# Patient Record
Sex: Female | Born: 1960 | Race: Black or African American | Hispanic: No | Smoking: Current every day smoker
Health system: Southern US, Community
[De-identification: ages and names within clinical notes are randomized; demographics above are authoritative.]

## PROBLEM LIST (undated history)

## (undated) DIAGNOSIS — T7840XA Allergy, unspecified, initial encounter: Secondary | ICD-10-CM

## (undated) DIAGNOSIS — M329 Systemic lupus erythematosus, unspecified: Secondary | ICD-10-CM

## (undated) DIAGNOSIS — I10 Essential (primary) hypertension: Secondary | ICD-10-CM

## (undated) DIAGNOSIS — IMO0002 Reserved for concepts with insufficient information to code with codable children: Secondary | ICD-10-CM

## (undated) DIAGNOSIS — D649 Anemia, unspecified: Secondary | ICD-10-CM

## (undated) DIAGNOSIS — J45909 Unspecified asthma, uncomplicated: Secondary | ICD-10-CM

## (undated) HISTORY — PX: WRIST SURGERY: SHX841

## (undated) HISTORY — DX: Allergy, unspecified, initial encounter: T78.40XA

## (undated) HISTORY — DX: Essential (primary) hypertension: I10

## (undated) HISTORY — DX: Anemia, unspecified: D64.9

---

## 1998-09-26 HISTORY — PX: ABDOMINAL HYSTERECTOMY: SHX81

## 1998-12-29 ENCOUNTER — Inpatient Hospital Stay (HOSPITAL_COMMUNITY): Admission: EM | Admit: 1998-12-29 | Discharge: 1998-12-30 | Payer: Self-pay | Admitting: *Deleted

## 2001-05-12 ENCOUNTER — Emergency Department (HOSPITAL_COMMUNITY): Admission: EM | Admit: 2001-05-12 | Discharge: 2001-05-13 | Payer: Self-pay | Admitting: Emergency Medicine

## 2001-09-17 ENCOUNTER — Encounter: Admission: RE | Admit: 2001-09-17 | Discharge: 2001-09-17 | Payer: Self-pay | Admitting: Family Medicine

## 2001-09-17 ENCOUNTER — Encounter: Payer: Self-pay | Admitting: Family Medicine

## 2004-07-01 ENCOUNTER — Emergency Department (HOSPITAL_COMMUNITY): Admission: EM | Admit: 2004-07-01 | Discharge: 2004-07-01 | Payer: Self-pay | Admitting: Family Medicine

## 2004-09-26 HISTORY — PX: COLONOSCOPY: SHX174

## 2004-12-22 ENCOUNTER — Emergency Department: Payer: Self-pay | Admitting: Emergency Medicine

## 2008-11-24 ENCOUNTER — Encounter: Admission: RE | Admit: 2008-11-24 | Discharge: 2008-11-24 | Payer: Self-pay | Admitting: Family Medicine

## 2009-10-08 ENCOUNTER — Emergency Department (HOSPITAL_COMMUNITY): Admission: EM | Admit: 2009-10-08 | Discharge: 2009-10-08 | Payer: Self-pay | Admitting: Emergency Medicine

## 2010-01-31 ENCOUNTER — Emergency Department (HOSPITAL_COMMUNITY): Admission: EM | Admit: 2010-01-31 | Discharge: 2010-01-31 | Payer: Self-pay | Admitting: Emergency Medicine

## 2010-11-10 ENCOUNTER — Other Ambulatory Visit: Payer: Self-pay | Admitting: Internal Medicine

## 2010-11-10 DIAGNOSIS — Z1231 Encounter for screening mammogram for malignant neoplasm of breast: Secondary | ICD-10-CM

## 2010-11-18 ENCOUNTER — Ambulatory Visit: Payer: Self-pay

## 2011-12-12 ENCOUNTER — Ambulatory Visit: Payer: Self-pay | Admitting: Family Medicine

## 2011-12-12 DIAGNOSIS — J45909 Unspecified asthma, uncomplicated: Secondary | ICD-10-CM | POA: Insufficient documentation

## 2011-12-12 DIAGNOSIS — I10 Essential (primary) hypertension: Secondary | ICD-10-CM

## 2011-12-12 MED ORDER — KETOCONAZOLE 2 % EX CREA: TOPICAL_CREAM | Freq: Every day | CUTANEOUS | Status: DC

## 2011-12-12 MED ORDER — DOXYCYCLINE HYCLATE 100 MG PO TABS: 100.0000 mg | ORAL_TABLET | Freq: Two times a day (BID) | ORAL | Status: AC

## 2011-12-12 MED ORDER — ALBUTEROL SULFATE HFA 108 (90 BASE) MCG/ACT IN AERS: 2.0000 | INHALATION_SPRAY | Freq: Four times a day (QID) | RESPIRATORY_TRACT | Status: DC | PRN

## 2011-12-12 NOTE — Patient Instructions (Signed)
Hypertension       Hypertension is another name for high blood pressure. High blood pressure may mean that your heart needs to work harder to pump blood. Blood pressure consists of two numbers, which includes a higher number over a lower number (example: 110/72).     HOME CARE     Make lifestyle changes as told by your doctor. This may include weight loss and exercise.   Take your blood pressure medicine every day.   Limit how much salt you use.   Stop smoking if you smoke.   Do not use drugs.   Talk to your doctor if you are using decongestants or birth control pills. These medicines might make blood pressure higher.   Females should not drink more than 1 alcoholic drink per day. Males should not drink more than 2 alcoholic drinks per day.   See your doctor as told.    GET HELP RIGHT AWAY IF:     You have a blood pressure reading with a top number of 180 or higher.   You get a very bad headache.   You get blurred or changing vision.   You feel confused.   You feel weak, numb, or faint.   You get chest or belly (abdominal) pain.   You throw up (vomit).   You cannot breathe very well.    MAKE SURE YOU:     Understand these instructions.   Will watch your condition.   Will get help right away if you are not doing well or get worse.      Document Released: 02/29/2008 Document Revised: 09/01/2011 Document Reviewed: 02/29/2008   ExitCare Patient Information 2012 [employer].

## 2011-12-12 NOTE — Progress Notes (Signed)
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  She keeps getting new lesions that begin with one little spot and then enlargen.  O:  Crusty lesions on chest and back  I spent time trying to convince patient to start on bp meds tonight.  She said she will consider at next wellness visit  A:  Tinea corp with secondary staph infection  P:  See orders.

## 2011-12-22 ENCOUNTER — Ambulatory Visit: Payer: Self-pay | Admitting: Family Medicine

## 2011-12-22 DIAGNOSIS — J45909 Unspecified asthma, uncomplicated: Secondary | ICD-10-CM

## 2011-12-22 DIAGNOSIS — J309 Allergic rhinitis, unspecified: Secondary | ICD-10-CM

## 2011-12-22 MED ORDER — AZITHROMYCIN 250 MG PO TABS: ORAL_TABLET | ORAL | Status: AC

## 2011-12-22 MED ORDER — PREDNISONE 20 MG PO TABS: ORAL_TABLET | ORAL | Status: AC

## 2011-12-22 MED ORDER — ALBUTEROL SULFATE (2.5 MG/3ML) 0.083% IN NEBU
2.5000 mg | INHALATION_SOLUTION | Freq: Once | RESPIRATORY_TRACT | Status: AC
  Administered 2011-12-22: 2.5 mg via RESPIRATORY_TRACT

## 2011-12-22 MED ORDER — IPRATROPIUM BROMIDE 0.02 % IN SOLN
0.5000 mg | Freq: Once | RESPIRATORY_TRACT | Status: AC
  Administered 2011-12-22: 0.5 mg via RESPIRATORY_TRACT

## 2011-12-22 MED ORDER — MOMETASONE FURO-FORMOTEROL FUM 100-5 MCG/ACT IN AERO: 2.0000 | INHALATION_SPRAY | Freq: Two times a day (BID) | RESPIRATORY_TRACT | Status: DC

## 2011-12-22 MED ORDER — ALBUTEROL SULFATE HFA 108 (90 BASE) MCG/ACT IN AERS: 2.0000 | INHALATION_SPRAY | Freq: Four times a day (QID) | RESPIRATORY_TRACT | Status: DC | PRN

## 2011-12-22 NOTE — Progress Notes (Signed)
  Subjective:    Patient ID: Patricia Romero, female    DOB: 1961-04-08, 51 y.o.   MRN: [id number]  HPI Patient presents complaining of progressive cough, wheezing and SOB since Saturday.  Using ProAir prn; has never been on maintenance therapy PMH/ Asthma           Seasonal allergies   Review of Systems  Constitutional: Positive for chills. Negative for fever.  HENT: Positive for congestion.   Respiratory: Positive for cough, chest tightness, shortness of breath and wheezing.   Cardiovascular: Positive for chest pain.       Objective:   Physical Exam  Constitutional: She appears well-developed.  HENT:  Nose: Mucosal edema and rhinorrhea present.  Mouth/Throat: Mucous membranes are normal.  Neck: Neck supple.  Cardiovascular: Normal rate, regular rhythm and normal heart sounds.   Pulmonary/Chest: She has wheezes.  Neurological: She is alert.     After albuterol/atrovent neb- improved air movement With rare expiratory wheeze     Assessment & Plan:   1. Asthma  albuterol (PROVENTIL) (2.5 MG/3ML) 0.083% nebulizer solution 2.5 mg, ipratropium (ATROVENT) nebulizer solution 0.5 mg, mometasone-formoterol (DULERA) 100-5 MCG/ACT AERO, predniSONE (DELTASONE) 20 MG tablet, albuterol (PROVENTIL HFA;VENTOLIN HFA) 108 (90 BASE) MCG/ACT inhaler, azithromycin (ZITHROMAX) 250 MG tablet  2. Allergic rhinitis     Coupon for free Dulera inhaler provided Lots of education regarding asthma and need for maintenance therapy Call with follow up in 24 hours

## 2011-12-22 NOTE — Progress Notes (Deleted)
  Subjective:    Patient ID: Patricia Romero, female    DOB: October 27, 1960, 51 y.o.   MRN: [id number]  HPI    Review of Systems     Objective:   Physical Exam        Assessment & Plan:

## 2012-05-03 ENCOUNTER — Encounter (HOSPITAL_COMMUNITY): Payer: Self-pay | Admitting: Emergency Medicine

## 2012-05-03 ENCOUNTER — Emergency Department (HOSPITAL_COMMUNITY)
Admission: EM | Admit: 2012-05-03 | Discharge: 2012-05-03 | Disposition: A | Discharge status: Home / Self Care | Payer: Self-pay | Attending: Emergency Medicine | Admitting: Emergency Medicine

## 2012-05-03 DIAGNOSIS — J45909 Unspecified asthma, uncomplicated: Secondary | ICD-10-CM | POA: Insufficient documentation

## 2012-05-03 DIAGNOSIS — B354 Tinea corporis: Secondary | ICD-10-CM | POA: Insufficient documentation

## 2012-05-03 HISTORY — DX: Unspecified asthma, uncomplicated: J45.909

## 2012-05-03 MED ORDER — TERBINAFINE HCL 1 % EX CREA: TOPICAL_CREAM | Freq: Two times a day (BID) | CUTANEOUS | Status: DC

## 2012-05-03 NOTE — Discharge Instructions (Signed)
Ringworm, Body [Tinea Corporis]  Ringworm is a fungal infection of the skin and hair. Another name for this problem is Tinea Corporis. It has nothing to do with worms. A fungus is an organism that lives on dead cells (the outer layer of skin). It can involve the entire body. It can spread from infected pets. Tinea corporis can be a problem in wrestlers who may get the infection form other players/opponents, equipment and mats.  DIAGNOSIS   A skin scraping can be obtained from the affected area and by looking for fungus under the microscope. This is called a KOH examination.   HOME CARE INSTRUCTIONS    Ringworm may be treated with a topical antifungal cream, ointment, or oral medications.   If you are using a cream or ointment, wash infected skin. Dry it completely before application.   Scrub the skin with a buff puff or abrasive sponge using a shampoo with ketoconazole to remove dead skin and help treat the ringworm.   Have your pet treated by your veterinarian if it has the same infection.  SEEK MEDICAL CARE IF:    Your ringworm patch (fungus) continues to spread after 7 days of treatment.   Your rash is not gone in 4 weeks. Fungal infections are slow to respond to treatment. Some redness (erythema) may remain for several weeks after the fungus is gone.   The area becomes red, warm, tender, and swollen beyond the patch. This may be a secondary bacterial (germ) infection.   You have a fever.  Document Released: 09/09/2000 Document Revised: 09/01/2011 Document Reviewed: 02/20/2009  [employer] Patient Information 2012 [employer].

## 2012-05-03 NOTE — ED Provider Notes (Signed)
History     CSN: [id number]  Arrival date & time 05/03/12  2031   First MD Initiated Contact with Patient 05/03/12 2153      Chief Complaint  Patient presents with  . Allergic Reaction  . Pruritis    (Consider location/radiation/quality/duration/timing/severity/associated sxs/prior treatment) HPI Comments: Patient has had in her.  Skin rash.  For several weeks, the first area, started on the posterior section of her left upper arm.  Now.  She has a here, anterior neck, and upper back  Patient is a 51 y.o. female presenting with allergic reaction. The history is provided by the patient.  Allergic Reaction The primary symptoms are  rash. The primary symptoms do not include shortness of breath, nausea, diarrhea or dizziness.    Past Medical History  Diagnosis Date  . Asthma     Past Surgical History  Procedure Date  . Abdominal hysterectomy     History reviewed. No pertinent family history.  History  Substance Use Topics  . Smoking status: Not on file  . Smokeless tobacco: Not on file  . Alcohol Use: Yes     Social Use    OB History    Grav Para Term Preterm Abortions TAB SAB Ect Mult Living                  Review of Systems  Constitutional: Negative for fever and chills.  Respiratory: Negative for shortness of breath.   Gastrointestinal: Negative for nausea and diarrhea.  Musculoskeletal: Negative for myalgias.  Skin: Positive for rash. Negative for wound.  Neurological: Negative for dizziness.    Allergies  Cefixime and Bee venom  Home Medications   Current Outpatient Rx  Name Route Sig Dispense Refill  . ALBUTEROL SULFATE HFA 108 (90 BASE) MCG/ACT IN AERS Inhalation Inhale 2 puffs into the lungs every 6 (six) hours as needed.    Marland Kitchen LORATADINE 10 MG PO TABS Oral Take 10 mg by mouth daily.    . TERBINAFINE HCL 1 % EX CREA Topical Apply topically 2 (two) times daily. 30 g 0    BP 161/115  Pulse 74  Temp 98.4 F (36.9 C) (Oral)  Resp 16  SpO2  96%  Physical Exam  Constitutional: She appears well-developed.  HENT:  Head: Normocephalic.  Eyes: Pupils are equal, round, and reactive to light.  Neck: Normal range of motion.  Cardiovascular: Normal rate.   Musculoskeletal: Normal range of motion.  Skin: Skin is warm.       Lesion on her right upper arm appears to be a ringworm, but the lesions on the anterior neck, and upper back, although have the same general appearance.  Are not round, but they do have a central area of clearing with surrounding erythema    ED Course  Procedures (including critical care time)  Labs Reviewed - No data to display No results found.   1. Ringworm of body       MDM   Will treat with topical Lamisil, and have patient followup in 3-5 days for reevaluation        Arman Filter, NP 05/03/12 2219  Arman Filter, NP 05/03/12 2219

## 2012-05-03 NOTE — ED Notes (Signed)
Patient complaining of itchiness and "breaking out" on arms, back, and chest for the past few weeks.  Patient states that the itching has gotten worse of the past few days.  Multiple areas of lesions noted to right arm, neck, and upper back.  Patient states that it could be from possible insect bites.

## 2012-05-03 NOTE — ED Provider Notes (Signed)
Medical screening examination/treatment/procedure(s) were performed by non-physician practitioner and as supervising physician I was immediately available for consultation/collaboration.   Mailin Coglianese, MD 05/03/12 2331 

## 2012-05-06 ENCOUNTER — Emergency Department (HOSPITAL_COMMUNITY)
Admission: EM | Admit: 2012-05-06 | Discharge: 2012-05-06 | Disposition: A | Discharge status: Home / Self Care | Payer: Self-pay | Attending: Emergency Medicine | Admitting: Emergency Medicine

## 2012-05-06 ENCOUNTER — Encounter (HOSPITAL_COMMUNITY): Payer: Self-pay | Admitting: Family Medicine

## 2012-05-06 DIAGNOSIS — L02419 Cutaneous abscess of limb, unspecified: Secondary | ICD-10-CM | POA: Insufficient documentation

## 2012-05-06 DIAGNOSIS — J45909 Unspecified asthma, uncomplicated: Secondary | ICD-10-CM | POA: Insufficient documentation

## 2012-05-06 DIAGNOSIS — L03119 Cellulitis of unspecified part of limb: Secondary | ICD-10-CM

## 2012-05-06 MED ORDER — CEPHALEXIN 500 MG PO CAPS: 500.0000 mg | ORAL_CAPSULE | Freq: Four times a day (QID) | ORAL | Status: AC

## 2012-05-06 MED ORDER — SULFAMETHOXAZOLE-TRIMETHOPRIM 800-160 MG PO TABS: 2.0000 | ORAL_TABLET | Freq: Two times a day (BID) | ORAL | Status: AC

## 2012-05-06 MED ORDER — DIPHENHYDRAMINE HCL 25 MG PO CAPS
25.0000 mg | ORAL_CAPSULE | Freq: Once | ORAL | Status: AC
  Administered 2012-05-06: 25 mg via ORAL
  Filled 2012-05-06: qty 1

## 2012-05-06 NOTE — ED Provider Notes (Signed)
Medical screening examination/treatment/procedure(s) were conducted as a shared visit with non-physician practitioner(s) and myself.  I personally evaluated the patient during the encounter   Cyndra Numbers, MD 05/06/12 [phone]

## 2012-05-06 NOTE — ED Provider Notes (Signed)
History     CSN: [id number]  Arrival date & time 05/06/12  0805   First MD Initiated Contact with Patient 05/06/12 0810      No chief complaint on file.   (Consider location/radiation/quality/duration/timing/severity/associated sxs/prior treatment) HPI Comments: 51 y/o female presents with swelling on left leg s/p being bit by something about 7 hours ago before going to bed. States last night she thought it was a mosqito bite and had no swelling before she went to sleep. It was slightly itchy. When she woke up she noticed swelling and some redness. Came right to the ED and did not try taking anything to bring the swelling down. States she was bit by a spider in the past and had a similar reaction. Denies any fever, chills, rashes, trouble swallowing or breathing, or any pain around her knee.  The history is provided by the patient.    Past Medical History  Diagnosis Date  . Asthma     Past Surgical History  Procedure Date  . Abdominal hysterectomy     No family history on file.  History  Substance Use Topics  . Smoking status: Not on file  . Smokeless tobacco: Not on file  . Alcohol Use: Yes     Social Use    OB History    Grav Para Term Preterm Abortions TAB SAB Ect Mult Living                  Review of Systems  Constitutional: Negative for fever and chills.  HENT: Negative for trouble swallowing.   Respiratory: Negative for apnea.   Musculoskeletal: Negative for arthralgias.  Skin: Positive for color change. Negative for rash.       Positive for swelling    Allergies  Cefixime and Bee venom  Home Medications   Current Outpatient Rx  Name Route Sig Dispense Refill  . ALBUTEROL SULFATE HFA 108 (90 BASE) MCG/ACT IN AERS Inhalation Inhale 2 puffs into the lungs every 6 (six) hours as needed. For shortness of breath      BP 141/103  Pulse 63  Temp 97.9 F (36.6 C) (Oral)  Resp 20  SpO2 100%  Physical Exam  Constitutional: She is oriented to person,  place, and time. She appears well-developed and well-nourished. No distress.  HENT:  Head: Normocephalic and atraumatic.  Mouth/Throat: Oropharynx is clear and moist. No oropharyngeal exudate.  Eyes: Conjunctivae are normal.  Neck: Normal range of motion. Neck supple.  Cardiovascular: Normal rate, regular rhythm and normal heart sounds.   Pulmonary/Chest: Effort normal and breath sounds normal.  Neurological: She is alert and oriented to person, place, and time.  Skin: Skin is warm, dry and intact.       Edema and erythema noted medially near patella. No flucctuance. No open skin. No evidence of bite mark. Warm to touch.  Psychiatric: She has a normal mood and affect. Her behavior is normal.    ED Course  Procedures (including critical care time) INCISION AND DRAINAGE Performed by: Johnnette Gourd Consent: Verbal consent obtained. Risks and benefits: risks, benefits and alternatives were discussed Type: abscess  Body area: left knee  Anesthesia: local infiltration  Local anesthetic: lidocaine 2% without epinephrine  Anesthetic total: 5 ml  Complexity: complex Blunt dissection to break up loculations  Drainage: purulent  Drainage amount: large  Packing material: 1/2 in iodoform gauze  Patient tolerance: Patient tolerated the procedure well with no immediate complications.    Labs Reviewed - No data to  display No results found.   1. Cellulitis and abscess of leg       MDM  51 y/o female with edema on left knee s/p being bit by something about 7 hours ago. Exam not consistent with abscess. Will give benadryl 25mg  po and re-assess. 8:56 AM Erythema and edema decreasing s/p receiving benadryl. Denies developing a boil after previous spider bite. No history of cellulitis. 9:21 AM Developed flucctuance once some edema subsided. Will drain. 9:45 AM Purulent drainage from abscess. Surrounding erythema. Will treat with bactrim and keflex. Case discussed with Dr. Alto Denver  who agrees with plan of care.       Trevor Mace, PA-C 05/06/12 [phone]

## 2012-05-06 NOTE — ED Notes (Signed)
Per pt complaining of abscess to inner aspect of left knee. sts started about 6 hours ago.area red, swollen, hard and warm to touch.

## 2012-05-09 ENCOUNTER — Emergency Department (HOSPITAL_COMMUNITY)
Admission: EM | Admit: 2012-05-09 | Discharge: 2012-05-09 | Disposition: A | Discharge status: Home / Self Care | Payer: Self-pay | Attending: Emergency Medicine | Admitting: Emergency Medicine

## 2012-05-09 ENCOUNTER — Encounter (HOSPITAL_COMMUNITY): Payer: Self-pay | Admitting: *Deleted

## 2012-05-09 DIAGNOSIS — Z4800 Encounter for change or removal of nonsurgical wound dressing: Secondary | ICD-10-CM | POA: Insufficient documentation

## 2012-05-09 DIAGNOSIS — Z5189 Encounter for other specified aftercare: Secondary | ICD-10-CM

## 2012-05-09 DIAGNOSIS — Z91038 Other insect allergy status: Secondary | ICD-10-CM | POA: Insufficient documentation

## 2012-05-09 DIAGNOSIS — Z90710 Acquired absence of both cervix and uterus: Secondary | ICD-10-CM | POA: Insufficient documentation

## 2012-05-09 DIAGNOSIS — J45909 Unspecified asthma, uncomplicated: Secondary | ICD-10-CM | POA: Insufficient documentation

## 2012-05-09 DIAGNOSIS — Z888 Allergy status to other drugs, medicaments and biological substances status: Secondary | ICD-10-CM | POA: Insufficient documentation

## 2012-05-09 NOTE — ED Provider Notes (Signed)
History   This chart was scribed for Gavin Pound. Oletta Lamas, MD by Charolett Bumpers . The patient was seen in room TR09C/TR09C. Patient's care was started at 1118.    CSN: [id number]  Arrival date & time 05/09/12  9629   First MD Initiated Contact with Patient 05/09/12 1118      Chief Complaint  Patient presents with  . Wound Check    (Consider location/radiation/quality/duration/timing/severity/associated sxs/prior treatment) HPI Patricia Romero is a 51 y.o. female who presents to the Emergency Department for a wound check on her left thigh. Pt reports that she had an abscess, that was drained here in ED 3 days ago. Pt was started on abx at that time. Pt reports that the wound has improved with reduced swelling, pain and erythema. Pt is here to have packing removed.   Past Medical History  Diagnosis Date  . Asthma     Past Surgical History  Procedure Date  . Abdominal hysterectomy     History reviewed. No pertinent family history.  History  Substance Use Topics  . Smoking status: Not on file  . Smokeless tobacco: Not on file  . Alcohol Use: Yes     Social Use    OB History    Grav Para Term Preterm Abortions TAB SAB Ect Mult Living                  Review of Systems  Constitutional: Negative for fever and chills.  Gastrointestinal: Negative for nausea and vomiting.  Skin: Positive for wound. Negative for color change.    Allergies  Cefixime and Bee venom  Home Medications   Current Outpatient Rx  Name Route Sig Dispense Refill  . ALBUTEROL SULFATE HFA 108 (90 BASE) MCG/ACT IN AERS Inhalation Inhale 2 puffs into the lungs every 6 (six) hours as needed. For shortness of breath    . CEPHALEXIN 500 MG PO CAPS Oral Take 1 capsule (500 mg total) by mouth 4 (four) times daily. 40 capsule 0  . IBUPROFEN 200 MG PO TABS Oral Take 200 mg by mouth every 6 (six) hours as needed. For pain    . SULFAMETHOXAZOLE-TRIMETHOPRIM 800-160 MG PO TABS Oral Take 2 tablets by  mouth 2 (two) times daily. Two po bid x 10 days 40 tablet 0    BP 143/103  Pulse 72  Temp 98.1 F (36.7 C) (Oral)  Resp 16  SpO2 97%  Physical Exam  Nursing note and vitals reviewed. Constitutional: She is oriented to person, place, and time. She appears well-developed and well-nourished. No distress.  HENT:  Head: Normocephalic and atraumatic.  Neck: Neck supple. No tracheal deviation present.  Cardiovascular: Normal rate.   Pulmonary/Chest: Effort normal. No respiratory distress.  Musculoskeletal: Normal range of motion.  Neurological: She is alert and oriented to person, place, and time.  Skin: Skin is warm and dry. No rash noted.       Packing from a previous abscess removed. On left thigh, wound edges appropriately healing with no surrounding erythema. No pus drainage. Minimally indurated and appropriately minimally tender.    Psychiatric: She has a normal mood and affect. Her behavior is normal.    ED Course  Procedures (including critical care time)  DIAGNOSTIC STUDIES: Oxygen Saturation is 97% on room air, normal by my interpretation.    COORDINATION OF CARE:  11:20-Removed packing from a drained abscess on left thigh. Discussed planned course of treatment with the patient, who is agreeable at this time.  Labs Reviewed - No data to display No results found.   1. Wound check, abscess       MDM  I personally performed the services described in this documentation, which was scribed in my presence. The recorded information has been reviewed and considered.  Appears to be healing appropriately, packing left out to heal.  instructions provided       Gavin Pound. Mauri Temkin, MD 05/11/12 1544

## 2012-05-09 NOTE — Discharge Instructions (Signed)
Wound Check  Your wound appears healthy today. Your wound will heal gradually over time. Eventually a scar will form that will fade with time.  FACTORS THAT AFFECT SCAR FORMATION:   People differ in the severity in which they scar.   Scar severity varies according to location, size, and the traits you inherited from your parents (genetic predisposition).   Irritation to the wound from infection, rubbing, or chemical exposure will increase the amount of scar formation.  HOME CARE INSTRUCTIONS    If you were given a dressing, you should change it at least once a day or as instructed by your caregiver. If the bandage sticks, soak it off with a solution of hydrogen peroxide.   If the bandage becomes wet, dirty, or develops a bad smell, change it as soon as possible.   Look for signs of infection.   Only take over-the-counter or prescription medicines for pain, discomfort, or fever as directed by your caregiver.  SEEK IMMEDIATE MEDICAL CARE IF:    You have redness, swelling, or increasing pain in the wound.   You notice pus coming from the wound.   You have a fever.   You notice a bad smell coming from the wound or dressing.  Document Released: 06/18/2004 Document Revised: 09/01/2011 Document Reviewed: 09/12/2005  [employer] Patient Information 2012 [employer].

## 2012-05-09 NOTE — ED Notes (Signed)
Applied bacitracin to left thigh and covered with bandage patient tolerated without incident

## 2012-05-09 NOTE — ED Notes (Signed)
Pt reports needing packing removed fromwound left thigh, was placed on Sunday.

## 2012-11-02 ENCOUNTER — Emergency Department (HOSPITAL_COMMUNITY)
Admission: EM | Admit: 2012-11-02 | Discharge: 2012-11-02 | Disposition: A | Discharge status: Home / Self Care | Payer: BC Managed Care – PPO | Attending: Emergency Medicine | Admitting: Emergency Medicine

## 2012-11-02 ENCOUNTER — Encounter (HOSPITAL_COMMUNITY): Payer: Self-pay

## 2012-11-02 DIAGNOSIS — J45909 Unspecified asthma, uncomplicated: Secondary | ICD-10-CM | POA: Insufficient documentation

## 2012-11-02 DIAGNOSIS — I10 Essential (primary) hypertension: Secondary | ICD-10-CM | POA: Insufficient documentation

## 2012-11-02 DIAGNOSIS — Z87440 Personal history of urinary (tract) infections: Secondary | ICD-10-CM | POA: Insufficient documentation

## 2012-11-02 DIAGNOSIS — R7402 Elevation of levels of lactic acid dehydrogenase (LDH): Secondary | ICD-10-CM | POA: Insufficient documentation

## 2012-11-02 DIAGNOSIS — Z79899 Other long term (current) drug therapy: Secondary | ICD-10-CM | POA: Insufficient documentation

## 2012-11-02 DIAGNOSIS — Z862 Personal history of diseases of the blood and blood-forming organs and certain disorders involving the immune mechanism: Secondary | ICD-10-CM | POA: Insufficient documentation

## 2012-11-02 DIAGNOSIS — D72819 Decreased white blood cell count, unspecified: Secondary | ICD-10-CM | POA: Insufficient documentation

## 2012-11-02 DIAGNOSIS — R7401 Elevation of levels of liver transaminase levels: Secondary | ICD-10-CM | POA: Insufficient documentation

## 2012-11-02 DIAGNOSIS — Z3202 Encounter for pregnancy test, result negative: Secondary | ICD-10-CM | POA: Insufficient documentation

## 2012-11-02 DIAGNOSIS — R42 Dizziness and giddiness: Secondary | ICD-10-CM | POA: Insufficient documentation

## 2012-11-02 LAB — COMPREHENSIVE METABOLIC PANEL
ALT: 73 U/L — ABNORMAL HIGH (ref 0–35)
AST: 41 U/L — ABNORMAL HIGH (ref 0–37)
Albumin: 4 g/dL (ref 3.5–5.2)
Alkaline Phosphatase: 87 U/L (ref 39–117)
Calcium: 9.4 mg/dL (ref 8.4–10.5)
GFR calc Af Amer: >90 mL/min (ref >90)
Glucose, Bld: 89 mg/dL (ref 70–99)
Potassium: 3.8 mEq/L (ref 3.5–5.1)
Sodium: 139 mEq/L (ref 135–145)
Total Protein: 7.4 g/dL (ref 6.0–8.3)

## 2012-11-02 LAB — CBC WITH DIFFERENTIAL/PLATELET
Basophils Absolute: 0 10*3/uL (ref 0.0–0.1)
Eosinophils Absolute: 0 10*3/uL (ref 0.0–0.7)
Eosinophils Relative: 1 % (ref 0–5)
Lymphs Abs: 1.5 10*3/uL (ref 0.7–4.0)
MCH: 27.5 pg (ref 26.0–34.0)
MCV: 81.9 fL (ref 78.0–100.0)
Neutrophils Relative %: 41 % — ABNORMAL LOW (ref 43–77)
Platelets: 236 10*3/uL (ref 150–400)
RBC: 4.54 MIL/uL (ref 3.87–5.11)
RDW: 13.5 % (ref 11.5–15.5)
WBC: 2.9 10*3/uL — ABNORMAL LOW (ref 4.0–10.5)

## 2012-11-02 LAB — URINALYSIS, ROUTINE W REFLEX MICROSCOPIC
Bilirubin Urine: NEGATIVE
Nitrite: NEGATIVE
Protein, ur: NEGATIVE mg/dL
Specific Gravity, Urine: 1.01 (ref 1.005–1.030)
Urobilinogen, UA: 0.2 mg/dL (ref 0.0–1.0)

## 2012-11-02 MED ORDER — LISINOPRIL 10 MG PO TABS: 10.0000 mg | ORAL_TABLET | Freq: Every day | ORAL | Status: DC

## 2012-11-02 NOTE — ED Provider Notes (Signed)
History     CSN: [id number]  Arrival date & time 11/02/12  1111   First MD Initiated Contact with Patient 11/02/12 1151      Chief Complaint  Patient presents with  . 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 This has been relatively persistent, nothing seems to make it better or worse including standing up or head position and is not associated with vomiting, vertigo, change in hearing, change in coordination or change in speech. She has no changes in vision, no chest pain abdominal pain or back pain and has had no diarrhea rashes or swelling. She does endorse a small amount of numbness to the left forearm and elbow but no problems using her hand and no numbness in the fingers. She also endorses urinary frequency and does have a history of frequent urinary tract infections. Her appetite has been normal, she has not had fevers or chills  The history is provided by the patient.    Past Medical History  Diagnosis Date  . Asthma     Past Surgical History  Procedure Date  . Abdominal hysterectomy     History reviewed. No pertinent family history.  History  Substance Use Topics  . Smoking status: Not on file  . Smokeless tobacco: Not on file  . Alcohol Use: Yes     Comment: Social Use    OB History    Grav Para Term Preterm Abortions TAB SAB Ect Mult Living                  Review of Systems  All other systems reviewed and are negative.    Allergies  Cefixime and Bee venom  Home Medications   Current Outpatient Rx  Name  Route  Sig  Dispense  Refill  . ALBUTEROL SULFATE HFA 108 (90 BASE) MCG/ACT IN AERS   Inhalation   Inhale 2 puffs into the lungs every 6 (six) hours as needed. For shortness  of breath         . LISINOPRIL 10 MG PO TABS   Oral   Take 1 tablet (10 mg total) by mouth daily.   30 tablet   1     BP 172/108  Pulse 89  Temp 98.1 F (36.7 C) (Oral)  Resp 16  SpO2 100%  Physical Exam  Nursing note and vitals reviewed. Constitutional: She appears well-developed and well-nourished. No distress.  HENT:  Head: Normocephalic and atraumatic.  Mouth/Throat: Oropharynx is clear and moist. No oropharyngeal exudate.  Eyes: Conjunctivae normal and EOM are normal. Pupils are equal, round, and reactive to light. Right eye exhibits no discharge. Left eye exhibits no discharge. No scleral icterus.  Neck: Normal range of motion. Neck supple. No JVD present. No thyromegaly present.  Cardiovascular: Normal rate, regular rhythm, normal heart sounds and intact distal pulses.  Exam reveals no gallop and no friction rub.   No murmur heard. Pulmonary/Chest: Effort normal and breath sounds normal. No respiratory distress. She has no wheezes. She has no rales.  Abdominal: Soft. Bowel sounds are normal. She exhibits distension ( Mild suprapubic tenderness, no guarding, no pain at McBurney's point, no peritoneal signs). She exhibits no mass. There is no tenderness.  Musculoskeletal: Normal range of motion. She exhibits no edema and no tenderness.  Normal strength, normal gait except for her right upper extremity which is abnormal after surgery as a 49-year-old child  Lymphadenopathy:    She has no cervical adenopathy.  Neurological: She is alert. Coordination normal.       Speech is clear, cranial nerves III through XII are intact, the patient questions whether there is a slight difference in the sensation to light touch of her cheeks right greater than left. She has normal extraocular movements, normal pupillary response, she is alert and oriented, she is able to ambulate without any difficulty, she has no limb ataxia and no gait ataxia. 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 This is baseline for her.  Skin: Skin is warm and dry. No rash noted. No erythema.  Psychiatric: She has a normal mood and affect. Her behavior is normal.    ED Course  Procedures (including critical care time)  Labs Reviewed  CBC WITH DIFFERENTIAL - Abnormal; Notable for the following:    WBC 2.9 (*)     Neutrophils Relative 41 (*)     Neutro Abs 1.2 (*)     Lymphocytes Relative 50 (*)     All other components within normal limits  COMPREHENSIVE METABOLIC PANEL - Abnormal; Notable for the following:    AST 41 (*)     ALT 73 (*)     All other components within normal limits  URINALYSIS, ROUTINE W REFLEX MICROSCOPIC  POCT PREGNANCY, URINE   No results found.   1. Light headed   2. Transaminitis   3. Leukopenia   4. Hypertension       MDM  Overall the patient is well appearing, vital signs reflect hypertension with a blood pressure of 170/111. Otherwise patient does appear clinically stable. I do not detect any focal neurologic deficits, there is no objective differences in sensation except for a slight amount of the left cheek which is decreased. Her gait is normal, she does not appear to be orthostatic, this could be related to hypertension, 2 anemia, to a urinary tract infection. Labs pending  Patient reexamined, she has no symptoms at that time, blood pressure has improved though at this point she has had to significantly elevated blood pressures and will require treatment for hypertension. She also has a mild transaminitis as well as a mild leukopenia. She has been informed of these results and I have recommended that she follow up very closely with family Dr. for repeat testing and further evaluation. She appears stable for discharge at this     Vida Roller, MD 11/02/12 1429

## 2012-11-02 NOTE — ED Notes (Signed)
Pt sts feeling better. Given something to eat per Dr. Hyacinth Meeker.

## 2012-11-02 NOTE — ED Notes (Signed)
Pt ambulatory back to room.  No dizziness or difficulty walking.  Pt undressed into gown and ready to be seen by provider

## 2012-11-02 NOTE — ED Notes (Signed)
Pt presents with 2 day h/o dizziness.  +nausea  Pt reports numbness to L arm that began while pt was in triage.

## 2012-11-24 DIAGNOSIS — I10 Essential (primary) hypertension: Secondary | ICD-10-CM

## 2012-11-24 HISTORY — DX: Essential (primary) hypertension: I10

## 2013-01-03 ENCOUNTER — Ambulatory Visit (INDEPENDENT_AMBULATORY_CARE_PROVIDER_SITE_OTHER): Payer: BC Managed Care – PPO | Admitting: Family Medicine

## 2013-01-03 VITALS — BP 144/86 | HR 73 | Resp 16 | Ht 67.0 in | Wt 137.4 lb

## 2013-01-03 DIAGNOSIS — D649 Anemia, unspecified: Secondary | ICD-10-CM

## 2013-01-03 DIAGNOSIS — Z1231 Encounter for screening mammogram for malignant neoplasm of breast: Secondary | ICD-10-CM

## 2013-01-03 DIAGNOSIS — Z0000 Encounter for general adult medical examination without abnormal findings: Secondary | ICD-10-CM

## 2013-01-03 DIAGNOSIS — I10 Essential (primary) hypertension: Secondary | ICD-10-CM

## 2013-01-03 DIAGNOSIS — R21 Rash and other nonspecific skin eruption: Secondary | ICD-10-CM

## 2013-01-03 DIAGNOSIS — Z01419 Encounter for gynecological examination (general) (routine) without abnormal findings: Secondary | ICD-10-CM

## 2013-01-03 LAB — POCT UA - MICROSCOPIC ONLY
Casts, Ur, LPF, POC: NEGATIVE
Crystals, Ur, HPF, POC: NEGATIVE
Mucus, UA: NEGATIVE
Yeast, UA: NEGATIVE

## 2013-01-03 LAB — POCT URINALYSIS DIPSTICK
Bilirubin, UA: NEGATIVE
Blood, UA: NEGATIVE
Leukocytes, UA: NEGATIVE
Nitrite, UA: NEGATIVE
Protein, UA: NEGATIVE
pH, UA: 7

## 2013-01-03 LAB — POCT CBC
Granulocyte percent: 50.5 %G (ref 37–80)
HCT, POC: 34.6 % — AB (ref 37.7–47.9)
Lymph, poc: 1.1 (ref 0.6–3.4)
MCHC: 30.1 g/dL — AB (ref 31.8–35.4)
MID (cbc): 0.2 (ref 0–0.9)
MPV: 9.6 fL (ref 0–99.8)
POC Granulocyte: 1.4 — AB (ref 2–6.9)
POC LYMPH PERCENT: 41.7 %L (ref 10–50)
POC MID %: 7.8 %M (ref 0–12)
Platelet Count, POC: 267 10*3/uL (ref 142–424)
RDW, POC: 14.6 %

## 2013-01-03 LAB — POCT GLYCOSYLATED HEMOGLOBIN (HGB A1C): Hemoglobin A1C: 5.5

## 2013-01-03 MED ORDER — LISINOPRIL-HYDROCHLOROTHIAZIDE 10-12.5 MG PO TABS: 1.0000 | ORAL_TABLET | Freq: Every day | ORAL | Status: DC

## 2013-01-03 MED ORDER — ALBUTEROL SULFATE HFA 108 (90 BASE) MCG/ACT IN AERS: 2.0000 | INHALATION_SPRAY | Freq: Four times a day (QID) | RESPIRATORY_TRACT | Status: DC | PRN

## 2013-01-03 NOTE — Patient Instructions (Addendum)
Routine general medical examination at a health care facility - Plan: POCT CBC, POCT glycosylated hemoglobin (Hb A1C), POCT urinalysis dipstick, POCT UA - Microscopic Only, Comprehensive metabolic panel, Lipid panel, TSH, Vitamin D 25 hydroxy, Vitamin B12, Folate, EKG 12-Lead, IFOBT POC (occult bld, rslt in office)  Routine gynecological examination - Plan: Pap IG (Image Guided)  Rash and nonspecific skin eruption - Plan: POCT Skin KOH, Fungus culture w smear   1. PLEASE RETURN IN SIX MONTHS FOR BLOOD PRESSURE FOLLOW-UP.
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
## 2013-01-04 LAB — COMPREHENSIVE METABOLIC PANEL
ALT: 44 U/L — ABNORMAL HIGH (ref 0–35)
AST: 23 U/L (ref 0–37)
Alkaline Phosphatase: 78 U/L (ref 39–117)
BUN: 16 mg/dL (ref 6–23)
Creat: 0.94 mg/dL (ref 0.50–1.10)
Potassium: 4.1 mEq/L (ref 3.5–5.3)

## 2013-01-04 LAB — LIPID PANEL
HDL: 37 mg/dL — ABNORMAL LOW (ref >39)
LDL Cholesterol: 114 mg/dL — ABNORMAL HIGH (ref 0–99)
Total CHOL/HDL Ratio: 5.3 Ratio

## 2013-01-04 LAB — TSH: TSH: 1.124 u[IU]/mL (ref 0.350–4.500)

## 2013-01-05 LAB — VITAMIN D 25 HYDROXY (VIT D DEFICIENCY, FRACTURES): Vit D, 25-Hydroxy: 19 ng/mL — ABNORMAL LOW (ref 30–89)

## 2013-01-07 LAB — PAP IG (IMAGE GUIDED)

## 2013-01-15 ENCOUNTER — Encounter: Payer: Self-pay | Admitting: Family Medicine

## 2013-01-16 MED ORDER — FLUCONAZOLE 150 MG PO TABS: 150.0000 mg | ORAL_TABLET | Freq: Once | ORAL | Status: DC

## 2013-02-04 LAB — CULTURE, FUNGUS WITHOUT SMEAR

## 2013-02-10 ENCOUNTER — Encounter: Payer: Self-pay | Admitting: Family Medicine

## 2013-02-11 ENCOUNTER — Telehealth: Payer: Self-pay

## 2013-02-11 ENCOUNTER — Ambulatory Visit
Admission: RE | Admit: 2013-02-11 | Discharge: 2013-02-11 | Disposition: A | Discharge status: Home / Self Care | Payer: BC Managed Care – PPO | Source: Ambulatory Visit | Attending: Family Medicine | Admitting: Family Medicine

## 2013-02-11 DIAGNOSIS — Z1231 Encounter for screening mammogram for malignant neoplasm of breast: Secondary | ICD-10-CM

## 2013-02-11 NOTE — Telephone Encounter (Signed)
Patient would like to talk to a nurse regarding side effects from her medication [phone]

## 2013-02-11 NOTE — Telephone Encounter (Signed)
Documented this as allergy.

## 2013-02-11 NOTE — Telephone Encounter (Signed)
Pt states 1st caused nausea 2nd pill caused some dizziness she is better now. She states she will come in if the dizziness returns.

## 2013-02-11 NOTE — Telephone Encounter (Signed)
To your pool [place]

## 2013-02-12 ENCOUNTER — Encounter: Payer: Self-pay | Admitting: Family Medicine

## 2013-08-01 ENCOUNTER — Other Ambulatory Visit: Payer: Self-pay

## 2013-11-26 ENCOUNTER — Emergency Department (HOSPITAL_COMMUNITY)
Admission: EM | Admit: 2013-11-26 | Discharge: 2013-11-26 | Disposition: A | Discharge status: Home / Self Care | Payer: BC Managed Care – PPO | Attending: Emergency Medicine | Admitting: Emergency Medicine

## 2013-11-26 ENCOUNTER — Encounter (HOSPITAL_COMMUNITY): Payer: Self-pay | Admitting: Emergency Medicine

## 2013-11-26 DIAGNOSIS — L509 Urticaria, unspecified: Secondary | ICD-10-CM | POA: Insufficient documentation

## 2013-11-26 DIAGNOSIS — Z8739 Personal history of other diseases of the musculoskeletal system and connective tissue: Secondary | ICD-10-CM | POA: Insufficient documentation

## 2013-11-26 DIAGNOSIS — J45909 Unspecified asthma, uncomplicated: Secondary | ICD-10-CM | POA: Insufficient documentation

## 2013-11-26 DIAGNOSIS — I10 Essential (primary) hypertension: Secondary | ICD-10-CM | POA: Insufficient documentation

## 2013-11-26 DIAGNOSIS — Z79899 Other long term (current) drug therapy: Secondary | ICD-10-CM | POA: Insufficient documentation

## 2013-11-26 DIAGNOSIS — Z862 Personal history of diseases of the blood and blood-forming organs and certain disorders involving the immune mechanism: Secondary | ICD-10-CM | POA: Insufficient documentation

## 2013-11-26 HISTORY — DX: Systemic lupus erythematosus, unspecified: M32.9

## 2013-11-26 HISTORY — DX: Reserved for concepts with insufficient information to code with codable children: IMO0002

## 2013-11-26 MED ORDER — PREDNISONE 20 MG PO TABS
60.0000 mg | ORAL_TABLET | Freq: Once | ORAL | Status: AC
  Administered 2013-11-26: 60 mg via ORAL
  Filled 2013-11-26: qty 3

## 2013-11-26 MED ORDER — FAMOTIDINE 20 MG PO TABS
20.0000 mg | ORAL_TABLET | Freq: Once | ORAL | Status: AC
  Administered 2013-11-26: 20 mg via ORAL
  Filled 2013-11-26: qty 1

## 2013-11-26 NOTE — ED Provider Notes (Signed)
Medical screening examination/treatment/procedure(s) were performed by non-physician practitioner and as supervising physician I was immediately available for consultation/collaboration.     Geoffery Lyonsouglas Amila Callies, MD 11/26/13 [phone]

## 2013-11-26 NOTE — ED Notes (Signed)
Pt dc to home. Pt sts understanding to dc instructions. Pt ambulatory to exit without difficulty. 

## 2013-11-26 NOTE — Discharge Instructions (Signed)
Take benadryl (25-50mg ) at least twice a day but up to four times a day for the next three days.  Take pepcid twice a day for the next three days.  You can also try claritin and cool compresses. Try to avoid scratching your skin because it may cause a secondary bacterial infection.  Please return to the ER immediately if your rash worsens, you develop swelling of lips/tongue or tightness of throat .
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
## 2013-11-26 NOTE — ED Notes (Signed)
Pt. reports itchy hives at back and legs onset this evening etiology unknown , respirations unlabored / airway intact.

## 2014-01-08 ENCOUNTER — Other Ambulatory Visit: Payer: Self-pay | Admitting: Family Medicine

## 2014-03-06 ENCOUNTER — Ambulatory Visit (INDEPENDENT_AMBULATORY_CARE_PROVIDER_SITE_OTHER): Payer: PRIVATE HEALTH INSURANCE | Admitting: Physician Assistant

## 2014-03-06 VITALS — BP 132/84 | HR 82 | Temp 97.8°F | Resp 16 | Ht 67.0 in | Wt 146.0 lb

## 2014-03-06 DIAGNOSIS — Z0000 Encounter for general adult medical examination without abnormal findings: Secondary | ICD-10-CM

## 2014-03-06 DIAGNOSIS — Z79899 Other long term (current) drug therapy: Secondary | ICD-10-CM

## 2014-03-06 DIAGNOSIS — K625 Hemorrhage of anus and rectum: Secondary | ICD-10-CM

## 2014-03-06 DIAGNOSIS — M329 Systemic lupus erythematosus, unspecified: Secondary | ICD-10-CM

## 2014-03-06 DIAGNOSIS — IMO0002 Reserved for concepts with insufficient information to code with codable children: Secondary | ICD-10-CM

## 2014-03-06 DIAGNOSIS — I10 Essential (primary) hypertension: Secondary | ICD-10-CM

## 2014-03-06 LAB — POCT URINALYSIS DIPSTICK
Bilirubin, UA: NEGATIVE
Glucose, UA: NEGATIVE
Ketones, UA: NEGATIVE
Leukocytes, UA: NEGATIVE
NITRITE UA: NEGATIVE
PH UA: 5.5
Protein, UA: NEGATIVE
RBC UA: NEGATIVE
SPEC GRAV UA: 1.01
UROBILINOGEN UA: 0.2

## 2014-03-06 LAB — POCT CBC
Granulocyte percent: 58.4 %G (ref 37–80)
HCT, POC: 38.4 % (ref 37.7–47.9)
Hemoglobin: 11.8 g/dL — AB (ref 12.2–16.2)
LYMPH, POC: 1.4 (ref 0.6–3.4)
MCH: 26.5 pg — AB (ref 27–31.2)
MCHC: 30.7 g/dL — AB (ref 31.8–35.4)
MCV: 86.2 fL (ref 80–97)
MID (CBC): 0.3 (ref 0–0.9)
MPV: 9.6 fL (ref 0–99.8)
PLATELET COUNT, POC: 295 10*3/uL (ref 142–424)
POC Granulocyte: 2.3 (ref 2–6.9)
POC LYMPH %: 34.5 % (ref 10–50)
POC MID %: 7.1 % (ref 0–12)
RBC: 4.46 M/uL (ref 4.04–5.48)
RDW, POC: 14.7 %
WBC: 4 10*3/uL — AB (ref 4.6–10.2)

## 2014-03-06 MED ORDER — LISINOPRIL 10 MG PO TABS: 10.0000 mg | ORAL_TABLET | Freq: Every day | ORAL | Status: DC

## 2014-03-06 MED ORDER — HYDROXYCHLOROQUINE SULFATE 200 MG PO TABS: 200.0000 mg | ORAL_TABLET | Freq: Every day | ORAL | Status: DC

## 2014-03-06 NOTE — Progress Notes (Signed)
Subjective:    Patient ID: Patricia AxeBarbara Romero, female    DOB: 10/02/1960, 53 y.o.   MRN: [id number]  HPI  [name] is a very pleasant 53 yr old female here for CPE.  Last CPE April 2011  Complaints: 2 episodes of BRBPR with bowel movements earlier this week; prior history of hemorrhoids - denies discomfort; normal colonscopy 2006; no further episodes of this, regular bowel movements every day GYN:  Hysterectomy in 2000 Mammo: normal 2014; no family hx br ca Colonoscopy:  2006, normal Dentist:  [facility]'t [facility] [place] Eye doctor:  Glasses; started wearing last fall; sees regularly due to plaquenil Imm:  Last tdap 2008 Diet:  Varied diet; stays away from fast food, junk food; mostly water to drink Exercise: walking for 30 minutes - started recently Meds:  Lisinopril, plaquenil Tobacco:  none Etoh:  1 drink/week  Work:  Runner, broadcasting/film/videoTeacher, pre-K  Plaquenil for lupus - has been x 1 yr Dr. Sharyn LullHaverstock - dermatology Has not established with rheum Has been tolerating well  Review of Systems  Constitutional: Negative.   HENT: Negative.   Respiratory: Negative.   Cardiovascular: Negative.   Gastrointestinal: Negative.   Genitourinary: Negative.   Musculoskeletal: Negative.        Objective:   Physical Exam  Vitals reviewed. Constitutional: She is oriented to person, place, and time. She appears well-developed and well-nourished. No distress.  HENT:  Head: Normocephalic and atraumatic.  Right Ear: Tympanic membrane and ear canal normal.  Left Ear: Tympanic membrane and ear canal normal.  Mouth/Throat: Uvula is midline, oropharynx is clear and moist and mucous membranes are normal.  Eyes: Conjunctivae are normal. No scleral icterus.  Neck: Normal range of motion. Neck supple.  Cardiovascular: Normal rate, regular rhythm and normal heart sounds.   Pulmonary/Chest: Effort normal and breath sounds normal. She has no wheezes. She has no rales.  Abdominal: Soft. Bowel sounds are normal.  There is no tenderness.  Genitourinary:  Small external hemorrhoid  Lymphadenopathy:    She has no cervical adenopathy.  Neurological: She is alert and oriented to person, place, and time. She has normal reflexes.  Skin: Skin is warm and dry.  Psychiatric: She has a normal mood and affect. Her behavior is normal.       Assessment & Plan:  1. Routine general medical examination at a health care facility [name] is a very pleasant 53 yr old female here for CPE and medication refills.  Exam is normal today.  Normal pap last year (due 2017), normal mammo last year - we discussed screening options.  She has never had an abnormal mammogram and has no family hx breast ca - pt prefers screening mammo every 2 yrs, which I think is reasonable.  Labs drawn today, including HIV test per pt request.   - POCT CBC - POCT urinalysis dipstick - Comprehensive metabolic panel - TSH - Lipid panel - HIV antibody  2. BRBPR (bright red blood per rectum) Pt reports 2 episodes of bright red blood with bowel movements.  Prior hx of hemorrhoids - small hemorrhoid on exam.  Normal colonoscopy in 2006.  Will send pt home with hemosure tests - if + will send to GI for further eval  - IFOBT POC (occult bld, rslt in office) - IFOBT POC (occult bld, rslt in office) - IFOBT POC (occult bld, rslt in office)  3. Medication management  - POCT CBC - Comprehensive metabolic panel - Ambulatory referral to Rheumatology  4. Hypertension Well  controlled BP on lisinopril.  Continue medication.  - lisinopril (PRINIVIL,ZESTRIL) 10 MG tablet; Take 1 tablet (10 mg total) by mouth daily.  Dispense: 90 tablet; Refill: 1  5. Lupus Pt on plaquenil x 1 yr for lupus diagnosed by dermatologist.  She has never followed with rheum, but would like referral which I think is appropriate.  Check CBC, CMP today.  Plaquenil refilled.  - hydroxychloroquine (PLAQUENIL) 200 MG tablet; Take 1 tablet (200 mg total) by mouth daily.   Dispense: 90 tablet; Refill: 1 - Ambulatory referral to Rheumatology     E. Frances Furbish MHS, PA-C [facility] 6/11/201510:46 PM

## 2014-03-06 NOTE — Patient Instructions (Signed)
Continue taking medications as directed.  Follow up with Dr. Renda Rolls as planned.  Follow up with your eye doctor as planned  I will let you know when your labs are back and if we need to do anything based on them  Please do the stool tests at home and return them to our office.  If these are positive, we may need to send you back to GI  Plan for a mammogram next year (2016) and pap test in two years (2017)   Health Maintenance, Female A healthy lifestyle and preventative care can promote health and wellness.  Maintain regular health, dental, and eye exams.  Eat a healthy diet. Foods like vegetables, fruits, whole grains, low-fat dairy products, and lean protein foods contain the nutrients you need without too many calories. Decrease your intake of foods high in solid fats, added sugars, and salt. Get information about a proper diet from your caregiver, if necessary.  Regular physical exercise is one of the most important things you can do for your health. Most adults should get at least 150 minutes of moderate-intensity exercise (any activity that increases your heart rate and causes you to sweat) each week. In addition, most adults need muscle-strengthening exercises on 2 or more days a week.   Maintain a healthy weight. The body mass index (BMI) is a screening tool to identify possible weight problems. It provides an estimate of body fat based on height and weight. Your caregiver can help determine your BMI, and can help you achieve or maintain a healthy weight. For adults 20 years and older:  A BMI below 18.5 is considered underweight.  A BMI of 18.5 to 24.9 is normal.  A BMI of 25 to 29.9 is considered overweight.  A BMI of 30 and above is considered obese.  Maintain normal blood lipids and cholesterol by exercising and minimizing your intake of saturated fat. Eat a balanced diet with plenty of fruits and vegetables. Blood tests for lipids and cholesterol should begin at age 52  and be repeated every 5 years. If your lipid or cholesterol levels are high, you are over 50, or you are a high risk for heart disease, you may need your cholesterol levels checked more frequently.Ongoing high lipid and cholesterol levels should be treated with medicines if diet and exercise are not effective.  If you smoke, find out from your caregiver how to quit. If you do not use tobacco, do not start.  Lung cancer screening is recommended for adults aged 72 80 years who are at high risk for developing lung cancer because of a history of smoking. Yearly low-dose computed tomography (CT) is recommended for people who have at least a 30-pack-year history of smoking and are a current smoker or have quit within the past 15 years. A pack year of smoking is smoking an average of 1 pack of cigarettes a day for 1 year (for example: 1 pack a day for 30 years or 2 packs a day for 15 years). Yearly screening should continue until the smoker has stopped smoking for at least 15 years. Yearly screening should also be stopped for people who develop a health problem that would prevent them from having lung cancer treatment.  If you are pregnant, do not drink alcohol. If you are breastfeeding, be very cautious about drinking alcohol. If you are not pregnant and choose to drink alcohol, do not exceed 1 drink per day. One drink is considered to be 12 ounces (355 mL) of beer,  5 ounces (148 mL) of wine, or 1.5 ounces (44 mL) of liquor.  Avoid use of street drugs. Do not share needles with anyone. Ask for help if you need support or instructions about stopping the use of drugs.  High blood pressure causes heart disease and increases the risk of stroke. Blood pressure should be checked at least every 1 to 2 years. Ongoing high blood pressure should be treated with medicines, if weight loss and exercise are not effective.  If you are 34 to 51 years old, ask your caregiver if you should take aspirin to prevent  strokes.  Diabetes screening involves taking a blood sample to check your fasting blood sugar level. This should be done once every 3 years, after age 82, if you are within normal weight and without risk factors for diabetes. Testing should be considered at a younger age or be carried out more frequently if you are overweight and have at least 1 risk factor for diabetes.  Breast cancer screening is essential preventative care for women. You should practice "breast self-awareness." This means understanding the normal appearance and feel of your breasts and may include breast self-examination. Any changes detected, no matter how small, should be reported to a caregiver. Women in their 38s and 30s should have a clinical breast exam (CBE) by a caregiver as part of a regular health exam every 1 to 3 years. After age 85, women should have a CBE every year. Starting at age 58, women should consider having a mammogram (breast X-ray) every year. Women who have a family history of breast cancer should talk to their caregiver about genetic screening. Women at a high risk of breast cancer should talk to their caregiver about having an MRI and a mammogram every year.  Breast cancer gene (BRCA)-related cancer risk assessment is recommended for women who have family members with BRCA-related cancers. BRCA-related cancers include breast, ovarian, tubal, and peritoneal cancers. Having family members with these cancers may be associated with an increased risk for harmful changes (mutations) in the breast cancer genes BRCA1 and BRCA2. Results of the assessment will determine the need for genetic counseling and BRCA1 and BRCA2 testing.  The Pap test is a screening test for cervical cancer. Women should have a Pap test starting at age 50. Between ages 31 and 106, Pap tests should be repeated every 2 years. Beginning at age 76, you should have a Pap test every 3 years as long as the past 3 Pap tests have been normal. If you had a  hysterectomy for a problem that was not cancer or a condition that could lead to cancer, then you no longer need Pap tests. If you are between ages 5 and 61, and you have had normal Pap tests going back 10 years, you no longer need Pap tests. If you have had past treatment for cervical cancer or a condition that could lead to cancer, you need Pap tests and screening for cancer for at least 20 years after your treatment. If Pap tests have been discontinued, risk factors (such as a new sexual partner) need to be reassessed to determine if screening should be resumed. Some women have medical problems that increase the chance of getting cervical cancer. In these cases, your caregiver may recommend more frequent screening and Pap tests.  The human papillomavirus (HPV) test is an additional test that may be used for cervical cancer screening. The HPV test looks for the virus that can cause the cell changes on the  cervix. The cells collected during the Pap test can be tested for HPV. The HPV test could be used to screen women aged 64 years and older, and should be used in women of any age who have unclear Pap test results. After the age of 65, women should have HPV testing at the same frequency as a Pap test.  Colorectal cancer can be detected and often prevented. Most routine colorectal cancer screening begins at the age of 45 and continues through age 60. However, your caregiver may recommend screening at an earlier age if you have risk factors for colon cancer. On a yearly basis, your caregiver may provide home test kits to check for hidden blood in the stool. Use of a small camera at the end of a tube, to directly examine the colon (sigmoidoscopy or colonoscopy), can detect the earliest forms of colorectal cancer. Talk to your caregiver about this at age 13, when routine screening begins. Direct examination of the colon should be repeated every 5 to 10 years through age 53, unless early forms of pre-cancerous  polyps or small growths are found.  Hepatitis C blood testing is recommended for all people born from [birth year] through 1963 and any individual with known risks for hepatitis C.  Practice safe sex. Use condoms and avoid high-risk sexual practices to reduce the spread of sexually transmitted infections (STIs). Sexually active women aged 41 and younger should be checked for Chlamydia, which is a common sexually transmitted infection. Older women with new or multiple partners should also be tested for Chlamydia. Testing for other STIs is recommended if you are sexually active and at increased risk.  Osteoporosis is a disease in which the bones lose minerals and strength with aging. This can result in serious bone fractures. The risk of osteoporosis can be identified using a bone density scan. Women ages 46 and over and women at risk for fractures or osteoporosis should discuss screening with their caregivers. Ask your caregiver whether you should be taking a calcium supplement or vitamin D to reduce the rate of osteoporosis.  Menopause can be associated with physical symptoms and risks. Hormone replacement therapy is available to decrease symptoms and risks. You should talk to your caregiver about whether hormone replacement therapy is right for you.  Use sunscreen. Apply sunscreen liberally and repeatedly throughout the day. You should seek shade when your shadow is shorter than you. Protect yourself by wearing long sleeves, pants, a wide-brimmed hat, and sunglasses year round, whenever you are outdoors.  Notify your caregiver of new moles or changes in moles, especially if there is a change in shape or color. Also notify your caregiver if a mole is larger than the size of a pencil eraser.  Stay current with your immunizations. Document Released: 03/28/2011 Document Revised: 01/07/2013 Document Reviewed: 03/28/2011 [facility] Patient Information 2014 [place].

## 2014-03-07 LAB — COMPREHENSIVE METABOLIC PANEL
ALBUMIN: 4.6 g/dL (ref 3.5–5.2)
ALK PHOS: 72 U/L (ref 39–117)
ALT: 50 U/L — ABNORMAL HIGH (ref 0–35)
AST: 23 U/L (ref 0–37)
BUN: 16 mg/dL (ref 6–23)
CALCIUM: 9.6 mg/dL (ref 8.4–10.5)
CHLORIDE: 102 meq/L (ref 96–112)
CO2: 29 mEq/L (ref 19–32)
CREATININE: 0.95 mg/dL (ref 0.50–1.10)
GLUCOSE: 117 mg/dL — AB (ref 70–99)
POTASSIUM: 4 meq/L (ref 3.5–5.3)
Sodium: 141 mEq/L (ref 135–145)
Total Bilirubin: 0.4 mg/dL (ref 0.2–1.2)
Total Protein: 6.9 g/dL (ref 6.0–8.3)

## 2014-03-07 LAB — LIPID PANEL
Cholesterol: 199 mg/dL (ref 0–200)
HDL: 44 mg/dL (ref >39)
LDL CALC: 98 mg/dL (ref 0–99)
Total CHOL/HDL Ratio: 4.5 Ratio
Triglycerides: 284 mg/dL — ABNORMAL HIGH (ref <150)
VLDL: 57 mg/dL — AB (ref 0–40)

## 2014-03-07 LAB — TSH: TSH: 0.981 u[IU]/mL (ref 0.350–4.500)

## 2014-03-11 ENCOUNTER — Encounter: Payer: Self-pay | Admitting: Physician Assistant

## 2014-03-11 ENCOUNTER — Other Ambulatory Visit: Payer: Self-pay | Admitting: Physician Assistant

## 2014-03-11 DIAGNOSIS — Z114 Encounter for screening for human immunodeficiency virus [HIV]: Secondary | ICD-10-CM

## 2014-03-17 ENCOUNTER — Telehealth: Payer: Self-pay

## 2014-03-17 NOTE — Telephone Encounter (Signed)
Questions answered.

## 2014-03-17 NOTE — Telephone Encounter (Signed)
PATIENT WANTS TO KNOW HOW THE FECAL TEST WORKS.

## 2014-03-24 ENCOUNTER — Other Ambulatory Visit (INDEPENDENT_AMBULATORY_CARE_PROVIDER_SITE_OTHER): Payer: PRIVATE HEALTH INSURANCE | Admitting: *Deleted

## 2014-03-24 DIAGNOSIS — Z1159 Encounter for screening for other viral diseases: Secondary | ICD-10-CM

## 2014-03-24 DIAGNOSIS — Z114 Encounter for screening for human immunodeficiency virus [HIV]: Secondary | ICD-10-CM

## 2014-03-24 LAB — IFOBT (OCCULT BLOOD)
IMMUNOLOGICAL FECAL OCCULT BLOOD TEST: NEGATIVE
IMMUNOLOGICAL FECAL OCCULT BLOOD TEST: NEGATIVE
IMMUNOLOGICAL FECAL OCCULT BLOOD TEST: NEGATIVE

## 2014-03-26 LAB — HIV ANTIBODY (ROUTINE TESTING W REFLEX): HIV: NONREACTIVE

## 2014-06-17 ENCOUNTER — Ambulatory Visit (INDEPENDENT_AMBULATORY_CARE_PROVIDER_SITE_OTHER): Payer: No Typology Code available for payment source | Admitting: Family Medicine

## 2014-06-17 VITALS — BP 132/80 | HR 68 | Temp 97.9°F | Resp 16 | Ht 67.0 in | Wt 142.0 lb

## 2014-06-17 DIAGNOSIS — R7989 Other specified abnormal findings of blood chemistry: Secondary | ICD-10-CM

## 2014-06-17 DIAGNOSIS — R6889 Other general symptoms and signs: Secondary | ICD-10-CM

## 2014-06-17 DIAGNOSIS — R7309 Other abnormal glucose: Secondary | ICD-10-CM

## 2014-06-17 DIAGNOSIS — M329 Systemic lupus erythematosus, unspecified: Secondary | ICD-10-CM

## 2014-06-17 DIAGNOSIS — R945 Abnormal results of liver function studies: Secondary | ICD-10-CM

## 2014-06-17 DIAGNOSIS — R739 Hyperglycemia, unspecified: Secondary | ICD-10-CM

## 2014-06-17 DIAGNOSIS — Z79899 Other long term (current) drug therapy: Secondary | ICD-10-CM

## 2014-06-17 NOTE — Patient Instructions (Signed)
We will call to your dermatologist to determine what specific concerns on labwork were.  We can call you after we receive that information for possible lab only visit or to discuss further workup if needed.
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
## 2014-07-11 ENCOUNTER — Other Ambulatory Visit: Payer: Self-pay

## 2015-01-19 ENCOUNTER — Ambulatory Visit (INDEPENDENT_AMBULATORY_CARE_PROVIDER_SITE_OTHER): Payer: No Typology Code available for payment source

## 2015-01-19 ENCOUNTER — Ambulatory Visit (INDEPENDENT_AMBULATORY_CARE_PROVIDER_SITE_OTHER): Payer: No Typology Code available for payment source | Admitting: Family Medicine

## 2015-01-19 VITALS — BP 90/76 | HR 102 | Temp 99.9°F | Resp 24 | Ht 67.0 in | Wt 135.1 lb

## 2015-01-19 DIAGNOSIS — R509 Fever, unspecified: Secondary | ICD-10-CM | POA: Diagnosis not present

## 2015-01-19 DIAGNOSIS — J4520 Mild intermittent asthma, uncomplicated: Secondary | ICD-10-CM | POA: Diagnosis not present

## 2015-01-19 DIAGNOSIS — Z8709 Personal history of other diseases of the respiratory system: Secondary | ICD-10-CM | POA: Diagnosis not present

## 2015-01-19 DIAGNOSIS — R05 Cough: Secondary | ICD-10-CM

## 2015-01-19 DIAGNOSIS — R059 Cough, unspecified: Secondary | ICD-10-CM

## 2015-01-19 LAB — POCT CBC
GRANULOCYTE PERCENT: 71.3 % (ref 37–80)
HEMATOCRIT: 40.8 % (ref 37.7–47.9)
Hemoglobin: 12.7 g/dL (ref 12.2–16.2)
Lymph, poc: 1.4 (ref 0.6–3.4)
MCH: 26.2 pg — AB (ref 27–31.2)
MCHC: 31.2 g/dL — AB (ref 31.8–35.4)
MCV: 84 fL (ref 80–97)
MID (CBC): 0.2 (ref 0–0.9)
MPV: 8.3 fL (ref 0–99.8)
POC GRANULOCYTE: 4.1 (ref 2–6.9)
POC LYMPH %: 24.6 % (ref 10–50)
POC MID %: 4.1 % (ref 0–12)
Platelet Count, POC: 235 10*3/uL (ref 142–424)
RBC: 4.86 M/uL (ref 4.04–5.48)
RDW, POC: 14.7 %
WBC: 5.7 10*3/uL (ref 4.6–10.2)

## 2015-01-19 MED ORDER — HYDROCODONE-HOMATROPINE 5-1.5 MG/5ML PO SYRP: 5.0000 mL | ORAL_SOLUTION | ORAL | Status: DC | PRN

## 2015-01-19 MED ORDER — ALBUTEROL SULFATE HFA 108 (90 BASE) MCG/ACT IN AERS: 2.0000 | INHALATION_SPRAY | Freq: Four times a day (QID) | RESPIRATORY_TRACT | Status: DC | PRN

## 2015-01-19 MED ORDER — BENZONATATE 100 MG PO CAPS: 100.0000 mg | ORAL_CAPSULE | Freq: Three times a day (TID) | ORAL | Status: DC | PRN

## 2015-01-19 NOTE — Progress Notes (Signed)
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 It started as a cold but immediately became a cough and is continued on. She gets wheezing. She is coughing quite hard. She's had a temperature of 102. She no longer smokes. She did not want any.  Objective: Wasn't lady, alert constant coughing. TMs normal except for some wax in the right. Throat clear. Neck supple without significant nodes. Chest has rhonchi throughout both lungs wheezing on the left upper. Heart regular without murmurs.  [facility] reading (PRIMARY) by  Dr. Alwyn RenHopper Normal chest x-ray.   Results for orders placed or performed in visit on 01/19/15  POCT CBC  Result Value Ref Range   WBC 5.7 4.6 - 10.2 K/uL   Lymph, poc 1.4 0.6 - 3.4   POC LYMPH PERCENT 24.6 10 - 50 %L   MID (cbc) 0.2 0 - 0.9   POC MID % 4.1 0 - 12 %M   POC Granulocyte 4.1 2 - 6.9   Granulocyte percent 71.3 37 - 80 %G   RBC 4.86 4.04 - 5.48 M/uL   Hemoglobin 12.7 12.2 - 16.2 g/dL   HCT, POC 16.140.8 09.637.7 - 47.9 %   MCV 84.0 80 - 97 fL   MCH, POC 26.2 (A) 27 - 31.2 pg   MCHC 31.2 (A) 31.8 - 35.4 g/dL   RDW, POC 04.514.7 %   Platelet Count, POC 235 142 - 424 K/uL   MPV 8.3 0 - 99.8 fL   Assessment: Viral bronchitis Cough Fever Wheezing  Plan: Albuterol, Tessalon, Hycodan, symptomatic treatments

## 2015-01-19 NOTE — Patient Instructions (Signed)
Drink plenty of fluids  Get enough rest  Stay off work through tomorrow  Use the inhaler 2 relations every 4-6 hours as needed for wheezing and coughing  Take the Hycodan cough syrup 1 teaspoon every 4-6 hours as needed for cough. This will tend to cause sedation and you probably don't want to take it when you're trying to work  Take the benzonatate cough pills one or 2 pills 3 times daily as needed for cough. These aren't as strong but they do not cause sedation so you can take them when you're going to work

## 2015-05-02 ENCOUNTER — Emergency Department (HOSPITAL_COMMUNITY)
Admission: EM | Admit: 2015-05-02 | Discharge: 2015-05-02 | Disposition: A | Discharge status: Home / Self Care | Payer: No Typology Code available for payment source | Attending: Emergency Medicine | Admitting: Emergency Medicine

## 2015-05-02 ENCOUNTER — Emergency Department (HOSPITAL_COMMUNITY): Payer: No Typology Code available for payment source

## 2015-05-02 ENCOUNTER — Encounter (HOSPITAL_COMMUNITY): Payer: Self-pay | Admitting: *Deleted

## 2015-05-02 DIAGNOSIS — Y929 Unspecified place or not applicable: Secondary | ICD-10-CM | POA: Insufficient documentation

## 2015-05-02 DIAGNOSIS — Y999 Unspecified external cause status: Secondary | ICD-10-CM | POA: Insufficient documentation

## 2015-05-02 DIAGNOSIS — S6991XA Unspecified injury of right wrist, hand and finger(s), initial encounter: Secondary | ICD-10-CM | POA: Insufficient documentation

## 2015-05-02 DIAGNOSIS — W19XXXA Unspecified fall, initial encounter: Secondary | ICD-10-CM

## 2015-05-02 DIAGNOSIS — Y9389 Activity, other specified: Secondary | ICD-10-CM | POA: Insufficient documentation

## 2015-05-02 DIAGNOSIS — W010XXA Fall on same level from slipping, tripping and stumbling without subsequent striking against object, initial encounter: Secondary | ICD-10-CM | POA: Insufficient documentation

## 2015-05-02 DIAGNOSIS — J45909 Unspecified asthma, uncomplicated: Secondary | ICD-10-CM | POA: Insufficient documentation

## 2015-05-02 DIAGNOSIS — S50312A Abrasion of left elbow, initial encounter: Secondary | ICD-10-CM | POA: Insufficient documentation

## 2015-05-02 DIAGNOSIS — Z8739 Personal history of other diseases of the musculoskeletal system and connective tissue: Secondary | ICD-10-CM | POA: Insufficient documentation

## 2015-05-02 DIAGNOSIS — M25522 Pain in left elbow: Secondary | ICD-10-CM

## 2015-05-02 DIAGNOSIS — Z862 Personal history of diseases of the blood and blood-forming organs and certain disorders involving the immune mechanism: Secondary | ICD-10-CM | POA: Insufficient documentation

## 2015-05-02 DIAGNOSIS — I10 Essential (primary) hypertension: Secondary | ICD-10-CM | POA: Insufficient documentation
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
## 2015-05-02 NOTE — ED Notes (Signed)
Taken for xray at this time. 
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
## 2015-05-02 NOTE — Discharge Instructions (Signed)
Musculoskeletal Pain Take ibuprofen or Tylenol for pain. Follow-up with her primary care physician. Apply ice. Rest. Elevate. Musculoskeletal pain is muscle and boney aches and pains. These pains can occur in any part of the body. Your caregiver may treat you without knowing the cause of the pain. They may treat you if blood or urine tests, X-rays, and other tests were normal.  CAUSES There is often not a definite cause or reason for these pains. These pains may be caused by a type of germ (virus). The discomfort may also come from overuse. Overuse includes working out too hard when your body is not fit. Boney aches also come from weather changes. Bone is sensitive to atmospheric pressure changes. HOME CARE INSTRUCTIONS   Ask when your test results will be ready. Make sure you get your test results.  Only take over-the-counter or prescription medicines for pain, discomfort, or fever as directed by your caregiver. If you were given medications for your condition, do not drive, operate machinery or power tools, or sign legal documents for 24 hours. Do not drink alcohol. Do not take sleeping pills or other medications that may interfere with treatment.  Continue all activities unless the activities cause more pain. When the pain lessens, slowly resume normal activities. Gradually increase the intensity and duration of the activities or exercise.  During periods of severe pain, bed rest may be helpful. Lay or sit in any position that is comfortable.  Putting ice on the injured area.  Put ice in a bag.  Place a towel between your skin and the bag.  Leave the ice on for 15 to 20 minutes, 3 to 4 times a day.  Follow up with your caregiver for continued problems and no reason can be found for the pain. If the pain becomes worse or does not go away, it may be necessary to repeat tests or do additional testing. Your caregiver may need to look further for a possible cause. SEEK IMMEDIATE MEDICAL CARE  IF:  You have pain that is getting worse and is not relieved by medications.  You develop chest pain that is associated with shortness or breath, sweating, feeling sick to your stomach (nauseous), or throw up (vomit).  Your pain becomes localized to the abdomen.  You develop any new symptoms that seem different or that concern you. MAKE SURE YOU:   Understand these instructions.  Will watch your condition.  Will get help right away if you are not doing well or get worse. Document Released: 09/12/2005 Document Revised: 12/05/2011 Document Reviewed: 05/17/2013 [facility] Patient Information 2015 [place], [place]. This information is not intended to replace advice given to you by your health care provider. Make sure you discuss any questions you have with your health care provider.

## 2015-05-18 ENCOUNTER — Ambulatory Visit (INDEPENDENT_AMBULATORY_CARE_PROVIDER_SITE_OTHER): Payer: Self-pay | Admitting: Physician Assistant

## 2015-05-18 VITALS — BP 126/80 | HR 62 | Temp 98.5°F | Resp 16 | Ht 67.0 in | Wt 137.6 lb

## 2015-05-18 DIAGNOSIS — Z111 Encounter for screening for respiratory tuberculosis: Secondary | ICD-10-CM

## 2015-05-18 NOTE — Patient Instructions (Signed)
Return in 48-72 hours to have your test read.

## 2015-05-18 NOTE — Progress Notes (Signed)
  Tuberculosis Risk Questionnaire  1. No Were you born outside the [place] in one of the following parts of the world: [place], Asia, Central [place], [place] America or Eastern [place]?    2. No Have you traveled outside the [place] and lived for more than one month in one of the following parts of the world: [place], [place], [place], [place] or Eastern [place]?    3. No Do you have a compromised immune system such as from any of the following conditions:HIV/AIDS, organ or bone marrow transplantation, diabetes, immunosuppressive medicines (e.g. Prednisone, Remicaide), leukemia, lymphoma, cancer of the head or neck, gastrectomy or jejunal bypass, end-stage renal disease (on dialysis), or silicosis?     4. No Have you ever or do you plan on working in: a residential care center, a health care facility, a jail or prison or homeless shelter?    5. No Have you ever: injected illegal drugs, used crack cocaine, lived in a homeless shelter  or been in jail or prison?     6. Yes  Have you ever been exposed to anyone with infectious tuberculosis?    Tuberculosis Symptom Questionnaire  Do you currently have any of the following symptoms?  1. No Unexplained cough lasting more than 3 weeks?   2. No Unexplained fever lasting more than 3 weeks.   3. No Night Sweats (sweating that leaves the bedclothes and sheets wet)     4. No Shortness of Breath   5. No Chest Pain   6. No Unintentional weight loss    7. No Unexplained fatigue (very tired for no reason)   
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
## 2015-05-20 ENCOUNTER — Ambulatory Visit (INDEPENDENT_AMBULATORY_CARE_PROVIDER_SITE_OTHER): Payer: No Typology Code available for payment source | Admitting: *Deleted

## 2015-05-20 ENCOUNTER — Encounter: Payer: Self-pay | Admitting: *Deleted

## 2015-05-20 DIAGNOSIS — Z111 Encounter for screening for respiratory tuberculosis: Secondary | ICD-10-CM

## 2015-05-20 LAB — TB SKIN TEST
INDURATION: 0 mm
TB Skin Test: NEGATIVE

## 2015-07-13 ENCOUNTER — Other Ambulatory Visit: Payer: Self-pay

## 2015-07-13 DIAGNOSIS — I10 Essential (primary) hypertension: Secondary | ICD-10-CM

## 2015-07-13 MED ORDER — LISINOPRIL 10 MG PO TABS: 10.0000 mg | ORAL_TABLET | Freq: Every day | ORAL | Status: DC

## 2015-07-19 NOTE — Progress Notes (Signed)
  Medical screening examination/treatment/procedure(s) were performed by non-physician practitioner and as supervising physician I was immediately available for consultation/collaboration.     

## 2015-09-04 ENCOUNTER — Ambulatory Visit (INDEPENDENT_AMBULATORY_CARE_PROVIDER_SITE_OTHER): Payer: Self-pay | Admitting: Physician Assistant

## 2015-09-04 VITALS — BP 108/68 | HR 74 | Temp 99.0°F | Resp 14 | Ht 67.0 in | Wt 135.0 lb

## 2015-09-04 DIAGNOSIS — H9201 Otalgia, right ear: Secondary | ICD-10-CM

## 2015-09-04 DIAGNOSIS — J029 Acute pharyngitis, unspecified: Secondary | ICD-10-CM

## 2015-09-04 LAB — POCT RAPID STREP A (OFFICE): RAPID STREP A SCREEN: NEGATIVE

## 2015-09-04 NOTE — Progress Notes (Signed)
  Medical screening examination/treatment/procedure(s) were performed by non-physician practitioner and as supervising physician I was immediately available for consultation/collaboration.     

## 2015-09-04 NOTE — Progress Notes (Signed)
   Subjective:    Patient ID: Quinn AxeBarbara Marmolejos, female    DOB: 11/29/1960, 54 y.o.   MRN: [id number]  Chief Complaint  Patient presents with  . Fever    Began last night  . Headache  . Sore Throat  . Ear Pain    Right ear    Medications, allergies, past medical history, surgical history, family history, social history and problem list reviewed and updated.  HPI  [age] yof presents with above complaints.   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 Right ear felt congested last night with pain anterior to ear. This has improved today. Mild frontal HA last night started gradually. Not worst of life. No neck pain. Did not check temp at home but felt warm. Sore throat started last night as well. No voice change. No chills. No sob or cough.   Review of Systems No cp, abd pain, n/v, diarrhea.     Objective:   Physical Exam  Constitutional: She appears well-developed and well-nourished.  Non-toxic appearance. She does not have a sickly appearance. She does not appear ill. No distress.  BP 108/68 mmHg  Pulse 74  Temp(Src) 99 F (37.2 C) (Oral)  Resp 14  Ht 5\' 7"  (1.702 m)  Wt 135 lb (61.236 kg)  BMI 21.14 kg/m2  SpO2 99%   HENT:  Right Ear: Tympanic membrane normal. Tympanic membrane is not erythematous.  Left Ear: Tympanic membrane normal. Tympanic membrane is not erythematous.  Nose: Right sinus exhibits no maxillary sinus tenderness and no frontal sinus tenderness. Left sinus exhibits no maxillary sinus tenderness and no frontal sinus tenderness.  Mouth/Throat: Uvula is midline, oropharynx is clear and moist and mucous membranes are normal.  No mastoid tenderness. No tragus tenderness. No erythema. No tenderness with auricle manipulation.   Neck: No Brudzinski's sign noted.  Pulmonary/Chest: Effort normal and breath sounds normal. No tachypnea.  Lymphadenopathy:       Head (right side): No submental, no submandibular and no tonsillar adenopathy  present.       Head (left side): No submental, no submandibular and no tonsillar adenopathy present.    She has no cervical adenopathy.   Results for orders placed or performed in visit on 09/04/15  POCT rapid strep A  Result Value Ref Range   Rapid Strep A Screen Negative Negative      Assessment & Plan:   Sore throat - Plan: POCT rapid strep A  Right ear pain --exam benign, vitals stable --no mastoid tenderness, TM normal --throat clear, no tender LAN, pt wished to have strep test as she is often around students with strep - strep swab negative --suspect viral uri, rest/fluids/tyelnol prn --rtc 3 days if not improving or if persistent fevers   Donnajean Lopesodd M. Kiya Eno, PA-C Physician Assistant-Certified [facility]  09/04/2015 11:33 AM

## 2015-09-04 NOTE — Addendum Note (Signed)
Addended by: Carmelina DaneANDERSON, Deysha Cartier S on: 09/04/2015 02:49 PM   Modules accepted: Kipp BroodSmartSet

## 2015-09-04 NOTE — Patient Instructions (Signed)
Your strep swab was negative.  You most likely have a viral upper respiratory infection. Rest, fluids, and tylenol should help to improve over the next few days. If you're not feeling better by early next or you start having persistent fevers please let us know asap.

## 2016-02-15 ENCOUNTER — Ambulatory Visit (INDEPENDENT_AMBULATORY_CARE_PROVIDER_SITE_OTHER): Payer: BLUE CROSS/BLUE SHIELD | Admitting: Physician Assistant

## 2016-02-15 VITALS — BP 138/86 | HR 92 | Temp 98.4°F | Resp 16 | Ht 67.0 in | Wt 137.0 lb

## 2016-02-15 DIAGNOSIS — J4520 Mild intermittent asthma, uncomplicated: Secondary | ICD-10-CM

## 2016-02-15 DIAGNOSIS — Z760 Encounter for issue of repeat prescription: Secondary | ICD-10-CM | POA: Diagnosis not present

## 2016-02-15 DIAGNOSIS — Z139 Encounter for screening, unspecified: Secondary | ICD-10-CM

## 2016-02-15 DIAGNOSIS — Z0000 Encounter for general adult medical examination without abnormal findings: Secondary | ICD-10-CM

## 2016-02-15 DIAGNOSIS — I10 Essential (primary) hypertension: Secondary | ICD-10-CM

## 2016-02-15 LAB — POCT CBC
GRANULOCYTE PERCENT: 47.7 % (ref 37–80)
HEMATOCRIT: 37.4 % — AB (ref 37.7–47.9)
Hemoglobin: 12.6 g/dL (ref 12.2–16.2)
Lymph, poc: 1.9 (ref 0.6–3.4)
MCH: 27.6 pg (ref 27–31.2)
MCHC: 33.6 g/dL (ref 31.8–35.4)
MCV: 82.1 fL (ref 80–97)
MID (cbc): 0.3 (ref 0–0.9)
MPV: 7.9 fL (ref 0–99.8)
PLATELET COUNT, POC: 235 10*3/uL (ref 142–424)
POC GRANULOCYTE: 2 (ref 2–6.9)
POC LYMPH %: 45.5 % (ref 10–50)
POC MID %: 6.8 %M (ref 0–12)
RBC: 4.56 M/uL (ref 4.04–5.48)
RDW, POC: 13.7 %
WBC: 4.1 10*3/uL — AB (ref 4.6–10.2)

## 2016-02-15 LAB — POCT GLYCOSYLATED HEMOGLOBIN (HGB A1C): Hemoglobin A1C: 5.7

## 2016-02-15 MED ORDER — ALBUTEROL SULFATE HFA 108 (90 BASE) MCG/ACT IN AERS: 2.0000 | INHALATION_SPRAY | Freq: Four times a day (QID) | RESPIRATORY_TRACT | Status: AC | PRN

## 2016-02-15 MED ORDER — LISINOPRIL 10 MG PO TABS: 10.0000 mg | ORAL_TABLET | Freq: Every day | ORAL | Status: DC

## 2016-02-15 NOTE — Progress Notes (Signed)
   02/15/2016 6:02 PM   DOB: 10/27/1960 / MRN: [id number]  SUBJECTIVE:  Quinn AxeBarbara Cogan is a 55 y.o. female presenting for an annual physical.   She is doing well and denies any complaints today. Her last pap was 3 years ago. She has screened negative for HIV.  Never smoker.    Immunization History  Administered Date(s) Administered  . PPD Test 05/18/2015  . Tdap 08/04/2007     She is allergic to cefixime; bee venom; and diflucan.   She  has a past medical history of Anemia; Hypertension (11/24/2012); Allergy; Asthma; and Lupus (HCC).    She  reports that she has never smoked. She has never used smokeless tobacco. She reports that she drinks alcohol. She reports that she does not use illicit drugs. She  has no sexual activity history on file. The patient  has past surgical history that includes Abdominal hysterectomy (09/26/1998); Colonoscopy (09/26/2004); and Wrist surgery.  Her family history includes Asthma in her sister; Cancer in her brother and sister; Heart disease in her mother; Hypertension in her brother, brother, brother, daughter, and mother.  Review of Systems  Constitutional: Negative for fever and chills.  Respiratory: Negative for cough.   Cardiovascular: Negative for chest pain.  Genitourinary: Negative for dysuria.  Skin: Negative for rash.  Neurological: Negative for dizziness and headaches.    Problem list and medications reviewed and updated by myself where necessary, and exist elsewhere in the encounter.   OBJECTIVE:  BP 138/86 mmHg  Pulse 92  Temp(Src) 98.4 F (36.9 C)  Resp 16  Ht 5\' 7"  (1.702 m)  Wt 137 lb (62.143 kg)  BMI 21.45 kg/m2  SpO2 98%  Physical Exam  Constitutional: She is oriented to person, place, and time. She appears well-nourished. No distress.  Eyes: EOM are normal. Pupils are equal, round, and reactive to light.  Cardiovascular: Normal rate and regular rhythm.   Pulmonary/Chest: Effort normal and breath sounds normal.  Abdominal:  She exhibits no distension.  Genitourinary: Vagina normal and uterus normal. No vaginal discharge found.  Musculoskeletal: Normal range of motion.  Neurological: She is alert and oriented to person, place, and time. No cranial nerve deficit. Gait normal.  Skin: Skin is dry. She is not diaphoretic.  Psychiatric: She has a normal mood and affect.  Vitals reviewed.   No results found for this or any previous visit (from the past 72 hour(s)).  No results found.  ASSESSMENT AND PLAN  Britta MccreedyBarbara was seen today for annual exam and employment physical.  Diagnoses and all orders for this visit:  Annual physical exam: Will get her medication refilled for one year.  She is doing quite well.  See screenings due below.  Needs a TDAP next year.   Screening -     Pap IG w/ reflex to HPV when ASC-U -     MM DIGITAL SCREENING BILATERAL; Future -     POCT CBC -     POCT glycosylated hemoglobin (Hb A1C) -     Lipid panel -     TSH -     COMPLETE METABOLIC PANEL WITH GFR -     HIV antibody    The patient was advised to call or return to clinic if she does not see an improvement in symptoms or to seek the care of the closest emergency department if she worsens with the above plan.   Deliah BostonMichael Brette Cast, MHS, PA-C [facility] 02/15/2016 6:02 PM

## 2016-02-15 NOTE — Patient Instructions (Signed)
     IF you received an x-ray today, you will receive an invoice from [facility]. Please contact [facility] at [phone] with questions or concerns regarding your invoice.   IF you received labwork today, you will receive an invoice from [facility]. Please contact [facility] at [phone] with questions or concerns regarding your invoice.   Our billing staff will not be able to assist you with questions regarding bills from these companies.  You will be contacted with the lab results as soon as they are available. The fastest way to get your results is to activate your My Chart account. Instructions are located on the last page of this paperwork. If you have not heard from us regarding the results in 2 weeks, please contact this office.      

## 2016-02-16 LAB — LIPID PANEL
Cholesterol: 177 mg/dL (ref 125–200)
HDL: 43 mg/dL — ABNORMAL LOW (ref >=46)
LDL CALC: 105 mg/dL (ref <130)
Total CHOL/HDL Ratio: 4.1 Ratio (ref <=5.0)
Triglycerides: 146 mg/dL (ref <150)
VLDL: 29 mg/dL (ref <30)

## 2016-02-16 LAB — COMPLETE METABOLIC PANEL WITH GFR
ALBUMIN: 4.6 g/dL (ref 3.6–5.1)
ALK PHOS: 93 U/L (ref 33–130)
ALT: 24 U/L (ref 6–29)
AST: 19 U/L (ref 10–35)
BILIRUBIN TOTAL: 0.6 mg/dL (ref 0.2–1.2)
BUN: 16 mg/dL (ref 7–25)
CALCIUM: 9.8 mg/dL (ref 8.6–10.4)
CHLORIDE: 104 mmol/L (ref 98–110)
CO2: 24 mmol/L (ref 20–31)
Creat: 0.9 mg/dL (ref 0.50–1.05)
GFR, EST AFRICAN AMERICAN: 84 mL/min (ref >=60)
GFR, Est Non African American: 73 mL/min (ref >=60)
Glucose, Bld: 148 mg/dL — ABNORMAL HIGH (ref 65–99)
Potassium: 4.3 mmol/L (ref 3.5–5.3)
Sodium: 140 mmol/L (ref 135–146)
TOTAL PROTEIN: 7.3 g/dL (ref 6.1–8.1)

## 2016-02-16 LAB — TSH: TSH: 0.86 mIU/L

## 2016-02-17 ENCOUNTER — Other Ambulatory Visit: Payer: Self-pay | Admitting: Physician Assistant

## 2016-02-17 DIAGNOSIS — Z1231 Encounter for screening mammogram for malignant neoplasm of breast: Secondary | ICD-10-CM

## 2016-02-17 LAB — PAP IG W/ RFLX HPV ASCU

## 2016-02-17 LAB — HIV ANTIBODY (ROUTINE TESTING W REFLEX): HIV: NONREACTIVE

## 2016-05-05 ENCOUNTER — Ambulatory Visit: Payer: No Typology Code available for payment source

## 2016-05-10 ENCOUNTER — Other Ambulatory Visit: Payer: Self-pay | Admitting: Physician Assistant

## 2016-05-10 DIAGNOSIS — I10 Essential (primary) hypertension: Secondary | ICD-10-CM

## 2016-05-12 ENCOUNTER — Other Ambulatory Visit: Payer: Self-pay

## 2016-05-12 DIAGNOSIS — I10 Essential (primary) hypertension: Secondary | ICD-10-CM

## 2016-05-12 MED ORDER — LISINOPRIL 10 MG PO TABS: 10.0000 mg | ORAL_TABLET | Freq: Every day | ORAL | 2 refills | Status: DC

## 2016-05-17 DIAGNOSIS — Z0271 Encounter for disability determination: Secondary | ICD-10-CM

## 2016-10-19 ENCOUNTER — Ambulatory Visit (INDEPENDENT_AMBULATORY_CARE_PROVIDER_SITE_OTHER): Payer: Medicaid Other | Admitting: Internal Medicine

## 2016-10-19 ENCOUNTER — Ambulatory Visit (HOSPITAL_COMMUNITY)
Admission: RE | Admit: 2016-10-19 | Discharge: 2016-10-19 | Disposition: A | Discharge status: Home / Self Care | Payer: Medicaid Other | Source: Ambulatory Visit | Attending: Internal Medicine | Admitting: Internal Medicine

## 2016-10-19 VITALS — BP 148/95 | HR 74 | Temp 98.1°F | Ht 67.0 in | Wt 141.0 lb

## 2016-10-19 DIAGNOSIS — J4520 Mild intermittent asthma, uncomplicated: Secondary | ICD-10-CM

## 2016-10-19 DIAGNOSIS — Z79899 Other long term (current) drug therapy: Secondary | ICD-10-CM | POA: Diagnosis not present

## 2016-10-19 DIAGNOSIS — Z8049 Family history of malignant neoplasm of other genital organs: Secondary | ICD-10-CM

## 2016-10-19 DIAGNOSIS — M329 Systemic lupus erythematosus, unspecified: Secondary | ICD-10-CM

## 2016-10-19 DIAGNOSIS — I10 Essential (primary) hypertension: Secondary | ICD-10-CM | POA: Diagnosis not present

## 2016-10-19 DIAGNOSIS — Z825 Family history of asthma and other chronic lower respiratory diseases: Secondary | ICD-10-CM

## 2016-10-19 DIAGNOSIS — Z888 Allergy status to other drugs, medicaments and biological substances status: Secondary | ICD-10-CM | POA: Diagnosis not present

## 2016-10-19 DIAGNOSIS — Z91030 Bee allergy status: Secondary | ICD-10-CM

## 2016-10-19 DIAGNOSIS — M79642 Pain in left hand: Secondary | ICD-10-CM | POA: Insufficient documentation

## 2016-10-19 DIAGNOSIS — M62838 Other muscle spasm: Secondary | ICD-10-CM | POA: Insufficient documentation

## 2016-10-19 DIAGNOSIS — Z0000 Encounter for general adult medical examination without abnormal findings: Secondary | ICD-10-CM

## 2016-10-19 DIAGNOSIS — Z823 Family history of stroke: Secondary | ICD-10-CM | POA: Diagnosis not present

## 2016-10-19 DIAGNOSIS — M25522 Pain in left elbow: Secondary | ICD-10-CM | POA: Insufficient documentation

## 2016-10-19 DIAGNOSIS — Z8249 Family history of ischemic heart disease and other diseases of the circulatory system: Secondary | ICD-10-CM

## 2016-10-19 DIAGNOSIS — F17210 Nicotine dependence, cigarettes, uncomplicated: Secondary | ICD-10-CM | POA: Diagnosis not present

## 2016-10-19 DIAGNOSIS — Z881 Allergy status to other antibiotic agents status: Secondary | ICD-10-CM | POA: Diagnosis not present

## 2016-10-19 MED ORDER — HYDROXYCHLOROQUINE SULFATE 200 MG PO TABS: 200.0000 mg | ORAL_TABLET | Freq: Every day | ORAL | 1 refills | Status: DC

## 2016-10-19 MED ORDER — LISINOPRIL 10 MG PO TABS: 10.0000 mg | ORAL_TABLET | Freq: Every day | ORAL | 2 refills | Status: DC

## 2016-10-19 NOTE — Assessment & Plan Note (Signed)
Patient complains of muscle spasms of her left hand. 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 She notes that her symptoms began roughly one week ago. She has baseline numbness and tingling in that extremity. These episodes are typically self-limited lasting between 5-10 minutes. She notes that she has fasciculation-type symptoms of her first 3 digits. She has no pain with these episodes. She has not tried to use her extremity during these episodes. On exam she has no atrophy noted of the thenar or hyperthenar musculature. She has intact sensation and strength. She has no elicitation of symptoms on palpation of the ulnar or median nerves around the elbow.  Patient notes that the symptoms are not terribly bothersome and have not impacted her daily functioning as of yet. I would recommend that we evaluate initially with plain films of the elbow and hand, as well as some basic electrolyte testing for potassium, calcium abnormalities. If the patient's symptoms persist or worsen I would consider EMG testing and/or referral to neurology.   Plan: X-ray of left elbow and left hand. CMP today.

## 2016-10-19 NOTE — Patient Instructions (Signed)
It was a pleasure to meet you today. We will get some blood tests and Xrays of your arm to evaluate your muscle spasms. I will call you with these resutls.  Continue to take your other medications regularly and if you notice that your joint pain is worsening, please call our clinic back for further evaluation.

## 2016-10-19 NOTE — Assessment & Plan Note (Signed)
Patient presents carrying a diagnosis of lupus. She has been on Plaquenil therapy for several years now. She initially developed cutaneous findings which were thought to be related and was seen by dermatology at that time. Chart review demonstrates that she was seen by rheumatology and at that time was found have a negative ANA. The patient reports that she has been seeing ophthalmology regularly for eye exams on her Plaquenil therapy. She complains of diffuse arthralgias and joint pain of the large and small joints. She denies any current skin changes or rash. She attempts to stay out of the sun in order to prevent these from developing.  Given the patient's negative ANA I am concerned that she may have been misdiagnosed with lupus. However she does have significant history of dermatologic issues combined with diffuse arthralgias which are suggestive of rheumatologic disorder. I cannot find records of more extensive workup having been done for this. I will look into obtaining further records and will defer further workup to her follow-up visit.  Plan: Continue Plaquenil 200 mg daily.

## 2016-10-19 NOTE — Assessment & Plan Note (Addendum)
-   declines flu shot - colonoscopy at age 56 - Mammogram 2 yrs ago, due now  Plan: Referral for mammography placed.

## 2016-10-19 NOTE — Assessment & Plan Note (Signed)
Patient is a history of high blood pressure which is well controlled on lisinopril 10 mg daily. She reports she takes this medication regularly. Patient denies any chest pain, shortness breath, changes in vision, headaches.  Blood pressures mildly elevated today at 148/95. Chart review demonstrates that she has previously been well controlled and would not recommend changing her therapy at this time.  Plan: Continue lisinopril 10 mg daily. Reevaluate blood pressure in 3 months.

## 2016-10-19 NOTE — Progress Notes (Signed)
CC: Establish Care for HTN, Asthma, and Lupus HPI: [name] is a [age] y.o. female with a h/o of SLE, HTN, Asthma who presents to establish care and for evaluation and management of the above conditions.  Please see Problem-based charting for HPI and the status of patient's chronic medical conditions.  Past Medical History:  Diagnosis Date  . Allergy    Claritin  year round  . Anemia   . Asthma    no hospitalizations; one ED visit per year; Albuterol use once per week.  . Hypertension 11/24/2012  . Lupus    Social History  Substance Use Topics  . Smoking status: Light Tobacco Smoker    Types: Cigarettes  . Smokeless tobacco: Never Used     Comment: Occasional  . Alcohol use No     Comment: Social Use   Family History  Problem Relation Age of Onset  . Hypertension Mother   . Heart disease Mother     AMI age 54; CHF; stents placed.  . Asthma Sister   . Cancer Sister     cervical  . Hypertension Brother   . Hypertension Brother   . CVA Brother   . Hypertension Daughter   . Hypertension Brother    Review of Systems: ROS in HPI. Otherwise: Review of Systems  Constitutional: Negative for chills, fever and weight loss.  Respiratory: Negative for cough and shortness of breath.   Cardiovascular: Negative for chest pain and leg swelling.  Gastrointestinal: Negative for abdominal pain, constipation, diarrhea, nausea and vomiting.  Genitourinary: Negative for dysuria, frequency and urgency.  Musculoskeletal: Positive for joint pain and myalgias.  Neurological: Positive for tingling and tremors.   Physical Exam: Vitals:   10/19/16 0824  BP: (!) 148/95  Pulse: 74  Temp: 98.1 F (36.7 C)  TempSrc: Oral  SpO2: 100%  Weight: 141 lb (64 kg)  Height: 5\' 7"  (1.702 m)   Physical Exam  Constitutional: She appears well-developed. She is cooperative. No distress.  Cardiovascular: Normal rate, regular rhythm, normal heart sounds and normal pulses.  Exam reveals no  gallop.   No murmur heard. Pulmonary/Chest: Effort normal and breath sounds normal. No respiratory distress. She has no wheezes. She has no rhonchi. She has no rales. Breasts are symmetrical.  Abdominal: Soft. Bowel sounds are normal. There is no tenderness.  Musculoskeletal: She exhibits no edema.    Assessment & Plan:  See encounters tab for problem based medical decision making. Patient discussed with Dr. Rogelia BogaButcher  Healthcare maintenance - declines flu shot - colonoscopy at age 56 - Mammogram 2 yrs ago, due now  Plan: Referral for mammography placed.  Lupus Patient presents carrying a diagnosis of lupus. She has been on Plaquenil therapy for several years now. She initially developed cutaneous findings which were thought to be related and was seen by dermatology at that time. Chart review demonstrates that she was seen by rheumatology and at that time was found have a negative ANA. The patient reports that she has been seeing ophthalmology regularly for eye exams on her Plaquenil therapy. She complains of diffuse arthralgias and joint pain of the large and small joints. She denies any current skin changes or rash. She attempts to stay out of the sun in order to prevent these from developing.  Given the patient's negative ANA I am concerned that she may have been misdiagnosed with lupus. However she does have significant history of dermatologic issues combined with diffuse arthralgias which are suggestive of rheumatologic disorder.  I cannot find records of more extensive workup having been done for this. I will look into obtaining further records and will defer further workup to her follow-up visit.  Plan: Continue Plaquenil 200 mg daily.   Muscle spasm Patient complains of muscle spasms of her left hand. 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 She notes that her symptoms began  roughly one week ago. She has baseline numbness and tingling in that extremity. These episodes are typically self-limited lasting between 5-10 minutes. She notes that she has fasciculation-type symptoms of her first 3 digits. She has no pain with these episodes. She has not tried to use her extremity during these episodes. On exam she has no atrophy noted of the thenar or hyperthenar musculature. She has intact sensation and strength. She has no elicitation of symptoms on palpation of the ulnar or median nerves around the elbow.  Patient notes that the symptoms are not terribly bothersome and have not impacted her daily functioning as of yet. I would recommend that we evaluate initially with plain films of the elbow and hand, as well as some basic electrolyte testing for potassium, calcium abnormalities. If the patient's symptoms persist or worsen I would consider EMG testing and/or referral to neurology.   Plan: X-ray of left elbow and left hand. CMP today.   Asthma Patient has a history of mild intermittent asthma which is uncompensated. She's never been hospitalized. She does not have any chronic controller inhalers. She takes albuterol as needed but has not had significant use of this in the last several months.  Patient is currently well controlled.  Plan: Continue albuterol when necessary  Hypertension Patient is a history of high blood pressure which is well controlled on lisinopril 10 mg daily. She reports she takes this medication regularly. Patient denies any chest pain, shortness breath, changes in vision, headaches.  Blood pressures mildly elevated today at 148/95. Chart review demonstrates that she has previously been well controlled and would not recommend changing her therapy at this time.  Plan: Continue lisinopril 10 mg daily. Reevaluate blood pressure in 3 months.   Signed: Carolynn Comment, MD 10/19/2016, 12:18 PM  Pager: [phone]

## 2016-10-19 NOTE — Assessment & Plan Note (Signed)
Patient has a history of mild intermittent asthma which is uncompensated. She's never been hospitalized. She does not have any chronic controller inhalers. She takes albuterol as needed but has not had significant use of this in the last several months.  Patient is currently well controlled.  Plan: Continue albuterol when necessary

## 2016-10-20 ENCOUNTER — Encounter: Payer: Self-pay | Admitting: Internal Medicine

## 2016-10-20 LAB — CMP14 + ANION GAP
ALBUMIN: 4.4 g/dL (ref 3.5–5.5)
ALK PHOS: 91 IU/L (ref 39–117)
ALT: 43 IU/L — ABNORMAL HIGH (ref 0–32)
AST: 25 IU/L (ref 0–40)
Albumin/Globulin Ratio: 1.8 (ref 1.2–2.2)
Anion Gap: 16 mmol/L (ref 10.0–18.0)
BUN/Creatinine Ratio: 16 (ref 9–23)
BUN: 15 mg/dL (ref 6–24)
Bilirubin Total: 0.4 mg/dL (ref 0.0–1.2)
CHLORIDE: 104 mmol/L (ref 96–106)
CO2: 25 mmol/L (ref 18–29)
Calcium: 9.7 mg/dL (ref 8.7–10.2)
Creatinine, Ser: 0.91 mg/dL (ref 0.57–1.00)
GFR calc non Af Amer: 72 mL/min/{1.73_m2} (ref >59)
GFR, EST AFRICAN AMERICAN: 83 mL/min/{1.73_m2} (ref >59)
GLOBULIN, TOTAL: 2.5 g/dL (ref 1.5–4.5)
Glucose: 89 mg/dL (ref 65–99)
Potassium: 4.6 mmol/L (ref 3.5–5.2)
SODIUM: 145 mmol/L — AB (ref 134–144)
TOTAL PROTEIN: 6.9 g/dL (ref 6.0–8.5)

## 2016-10-22 NOTE — Progress Notes (Signed)
Internal Medicine Clinic Attending  Case discussed with Dr. Strelow at the time of the visit.  We reviewed the resident's history and exam and pertinent patient test results.  I agree with the assessment, diagnosis, and plan of care documented in the resident's note.  

## 2016-10-24 ENCOUNTER — Ambulatory Visit: Payer: Self-pay

## 2016-10-28 ENCOUNTER — Ambulatory Visit: Payer: Self-pay

## 2016-11-03 ENCOUNTER — Ambulatory Visit: Payer: Self-pay

## 2016-11-04 ENCOUNTER — Ambulatory Visit: Payer: Self-pay | Admitting: Internal Medicine

## 2016-11-04 ENCOUNTER — Ambulatory Visit: Payer: Self-pay

## 2016-12-06 ENCOUNTER — Ambulatory Visit
Admission: RE | Admit: 2016-12-06 | Discharge: 2016-12-06 | Disposition: A | Discharge status: Home / Self Care | Payer: Medicaid Other | Source: Ambulatory Visit | Attending: Internal Medicine | Admitting: Internal Medicine

## 2016-12-06 DIAGNOSIS — Z0000 Encounter for general adult medical examination without abnormal findings: Secondary | ICD-10-CM

## 2016-12-27 ENCOUNTER — Other Ambulatory Visit: Payer: Self-pay

## 2016-12-27 DIAGNOSIS — M329 Systemic lupus erythematosus, unspecified: Secondary | ICD-10-CM

## 2016-12-27 NOTE — Telephone Encounter (Signed)
hydroxychloroquine (PLAQUENIL) 200 MG tablet, refill request @ [facility] on [address].

## 2017-01-02 NOTE — Telephone Encounter (Signed)
Pt had refill from 1/24, called pharmacy, this script went to [facility] and they do not carry it, called [facility] and gave script

## 2017-02-10 ENCOUNTER — Ambulatory Visit (INDEPENDENT_AMBULATORY_CARE_PROVIDER_SITE_OTHER): Payer: Medicaid Other | Admitting: Internal Medicine

## 2017-02-10 DIAGNOSIS — S90511A Abrasion, right ankle, initial encounter: Secondary | ICD-10-CM | POA: Diagnosis present

## 2017-02-10 DIAGNOSIS — W19XXXA Unspecified fall, initial encounter: Secondary | ICD-10-CM

## 2017-02-10 DIAGNOSIS — T148XXA Other injury of unspecified body region, initial encounter: Secondary | ICD-10-CM

## 2017-02-10 NOTE — Progress Notes (Signed)
Internal Medicine Clinic Attending  Case discussed with Dr. Ahmed at the time of the visit.  We reviewed the resident's history and exam and pertinent patient test results.  I agree with the assessment, diagnosis, and plan of care documented in the resident's note. 

## 2017-02-10 NOTE — Assessment & Plan Note (Signed)
Has skin abrasion from falling on right medial aspect of the ankle. No signs of infection or any other complication.  Supportive care with pain control (tylenol). Keep clean and covered.

## 2017-02-10 NOTE — Progress Notes (Signed)
   CC: ankle injury  HPI:  [name] is a 56 y.o. with pmh as listed below is here for ankle injury   Past Medical History:  Diagnosis Date  . Allergy    Claritin  year round  . Anemia   . Asthma    no hospitalizations; one ED visit per year; Albuterol use once per week.  . Hypertension 11/24/2012  . Lupus    Patient had a fell 2 days ago and has an abrasion on her right ankle which had clear drainage and bleeding. Has kept it covered with bandaid. No fever, has pain. No swelling. Wanted to come in and make sure it is not infected. No other sx.    Review of Systems:   Review of Systems  Constitutional: Negative for chills and fever.  Cardiovascular: Negative for chest pain and palpitations.  Musculoskeletal: Negative for myalgias and neck pain.  Skin:       Skin abrasion  Neurological: Negative for dizziness and headaches.  Psychiatric/Behavioral: Negative for depression.     Physical Exam:  Vitals:   02/10/17 1358  BP: 140/89  Pulse: 79  Temp: 98.1 F (36.7 C)  TempSrc: Oral  SpO2: 100%  Weight: 148 lb 1.6 oz (67.2 kg)   Physical Exam  Constitutional: She is oriented to person, place, and time. She appears well-developed and well-nourished. No distress.  HENT:  Head: Normocephalic and atraumatic.  Respiratory: Effort normal and breath sounds normal. No respiratory distress. She has no wheezes.  Musculoskeletal: Normal range of motion. She exhibits no edema.  Has area of abrasion from recent fall over right medial ankle. No induration, fluctuance, erythema, warmth, drainage, bleeding, or any other signs of infection.  Neurological: She is alert and oriented to person, place, and time.  Skin: She is not diaphoretic.  Psychiatric: She has a normal mood and affect.    Assessment & Plan:   See Encounters Tab for problem based charting.  Patient discussed with Dr. Cleda DaubE. Hoffman

## 2017-02-10 NOTE — Patient Instructions (Signed)
You have a skin abrasion  Supportive care with pain control (tylenol). Keep clean and covered.    Abrasion An abrasion is a cut or scrape on the surface of your skin. An abrasion does not go through all of the layers of your skin. It is important to take good care of your abrasion to prevent infection. Follow these instructions at home: Medicines   Take or apply medicines only as told by your doctor.  If you were prescribed an antibiotic ointment, finish all of it even if you start to feel better. Wound care   Clean the wound with mild soap and water 2-3 times per day or as told by your doctor. Pat your wound dry with a clean towel. Do not rub it.  There are many ways to close and cover a wound. Follow instructions from your doctor about:  How to take care of your wound.  When and how you should change your bandage (dressing).  When and how you should take off your dressing.  Check your wound every day for signs of infection. Watch for:  Redness, swelling, or pain.  Fluid, blood, or pus. General instructions   Keep the dressing dry as told by your doctor. Do not take baths, swim, use a hot tub, or do anything that would put your wound underwater until your doctor says it is okay.  If there is swelling, raise (elevate) the injured area above the level of your heart while you are sitting or lying down.  Keep all follow-up visits as told by your doctor. This is important. Contact a doctor if:  You were given a tetanus shot and you have any of these where the needle went in:  Swelling.  Very bad pain.  Redness.  Bleeding.  Medicine does not help your pain.  You have any of these at the site of the wound:  More redness.  More swelling.  More pain. Get help right away if:  You have a red streak going away from your wound.  You have a fever.  You have fluid, blood, or pus coming from your wound.  There is a bad smell coming from your wound. This  information is not intended to replace advice given to you by your health care provider. Make sure you discuss any questions you have with your health care provider. Document Released: 02/29/2008 Document Revised: 02/18/2016 Document Reviewed: 09/10/2014 Elsevier Interactive Patient Education  2017 [employer]

## 2017-02-10 NOTE — Addendum Note (Signed)
Addended by: Carlynn PurlHOFFMAN, Kobee Medlen C on: 02/10/2017 03:23 PM   Modules accepted: Level of Service

## 2017-02-20 ENCOUNTER — Telehealth: Payer: Self-pay | Admitting: Internal Medicine

## 2017-02-20 NOTE — Telephone Encounter (Signed)
   Reason for call:   I received a call from Ms. Quinn AxeBarbara Romero at 5:31 PM asking she wants to know what her doctor wanted her to do about her ankle. Stated she was recently seen in the internal medicine clinic.    Pertinent Data:   Larey SeatFell 12 days ago - slipped off a pavement because it was raining. Scraped off skin and ankle has been swollen since then.   Skin abrasion improving  Swelling not improving  Pain with ambulation; not improving   States she took 1 OTC Ibuprofen (200 mg) a day and stopped taking it 2 days ago because it wasn't helping.  Seen in the clinic on 5/18 and advised to take Tylenol. Patient is not taking Tylenol.   Denies having any fevers or chills. Reports feeling well otherwise.    Assessment / Plan / Recommendations:   Ankle pain likely secondary to sprain. However, since the swelling has not improved for the past 12 days she might need an x-ray to r/o fracture. No imaging has been done since she injured her ankle.   Advised her go to the clinic tomorrow morning. Until then, take OTC Ibuprofen 200 mg: 2 tabs (400 mg) q6 hrs.   RICE  Patient agreed with the plan.   As always, pt is advised that if symptoms worsen or new symptoms arise, they should go to an urgent care facility or to to ER for further evaluation.   John Giovanniathore, Favian Kittleson, MD   02/20/2017, 5:36 PM

## 2017-02-21 ENCOUNTER — Ambulatory Visit (INDEPENDENT_AMBULATORY_CARE_PROVIDER_SITE_OTHER): Payer: Medicaid Other | Admitting: Internal Medicine

## 2017-02-21 ENCOUNTER — Ambulatory Visit (HOSPITAL_COMMUNITY)
Admission: RE | Admit: 2017-02-21 | Discharge: 2017-02-21 | Disposition: A | Discharge status: Home / Self Care | Payer: Medicaid Other | Source: Ambulatory Visit | Attending: Student in an Organized Health Care Education/Training Program | Admitting: Student in an Organized Health Care Education/Training Program

## 2017-02-21 ENCOUNTER — Encounter: Payer: Self-pay | Admitting: Internal Medicine

## 2017-02-21 DIAGNOSIS — F17210 Nicotine dependence, cigarettes, uncomplicated: Secondary | ICD-10-CM | POA: Diagnosis not present

## 2017-02-21 DIAGNOSIS — M25571 Pain in right ankle and joints of right foot: Secondary | ICD-10-CM

## 2017-02-21 DIAGNOSIS — Z79899 Other long term (current) drug therapy: Secondary | ICD-10-CM | POA: Diagnosis not present

## 2017-02-21 DIAGNOSIS — Z9181 History of falling: Secondary | ICD-10-CM

## 2017-02-21 DIAGNOSIS — I10 Essential (primary) hypertension: Secondary | ICD-10-CM | POA: Diagnosis not present

## 2017-02-21 MED ORDER — LISINOPRIL 20 MG PO TABS: 20.0000 mg | ORAL_TABLET | Freq: Every day | ORAL | 2 refills | Status: DC

## 2017-02-21 NOTE — Progress Notes (Signed)
Internal Medicine Clinic Attending  Case discussed with Dr. Ahmed at the time of the visit.  We reviewed the resident's history and exam and pertinent patient test results.  I agree with the assessment, diagnosis, and plan of care documented in the resident's note. 

## 2017-02-21 NOTE — Progress Notes (Signed)
   CC: right ankle pain   HPI:  [name] is a 56 y.o. with pmh as listed below is here for right ankle pain   She was here on 5/18 with abrasion over the right ankle after falling 2 days prior to that. She is here today because she is still having pain on the medial malleolus area, hurts to walk but is able to walk. Swelling has improved.   Past Medical History:  Diagnosis Date  . Allergy    Claritin  year round  . Anemia   . Asthma    no hospitalizations; one ED visit per year; Albuterol use once per week.  . Hypertension 11/24/2012  . Lupus     Review of Systems:   Review of Systems  Constitutional: Negative for chills and fever.  Respiratory: Negative for shortness of breath.   Cardiovascular: Negative for chest pain.  Gastrointestinal: Negative for nausea and vomiting.  Musculoskeletal: Positive for joint pain.  Neurological: Negative for dizziness and headaches.     Physical Exam:  Vitals:   02/21/17 1522  BP: (!) 164/105  Temp: 98.6 F (37 C)  TempSrc: Oral  SpO2: 100%  Weight: 150 lb 9.6 oz (68.3 kg)  Height: 5\' 7"  (1.702 m)   Physical Exam  Constitutional: She appears well-developed and well-nourished. No distress.  HENT:  Head: Normocephalic and atraumatic.  Eyes: Conjunctivae are normal.  Cardiovascular: Normal rate and regular rhythm.  Exam reveals no gallop and no friction rub.   No murmur heard. Respiratory: Effort normal and breath sounds normal. No respiratory distress. She has no wheezes.  Musculoskeletal:  Right medial malleolus has mild tenderness, no swelling or erythema. Has some pain with everting the ankle joint otherwise has good ROM of the ankle joint without pain.   Skin abrasion is healing well. No open wounds.   Skin: She is not diaphoretic.    Assessment & Plan:   See Encounters Tab for problem based charting.  Patient discussed with Dr. Cleda DaubE. Hoffman

## 2017-02-21 NOTE — Assessment & Plan Note (Signed)
Vitals:   02/21/17 1522  BP: (!) 164/105  Temp: 98.6 F (37 C)   BP is high today, this could be from the acute pain or ibuprofen use but since it has been high for last few visit, will increase her lisinopril from 10 mg to 20 mg daily F/up in 1 month for recheck and BMET.

## 2017-02-21 NOTE — Assessment & Plan Note (Signed)
Having right ankle pain over the right medial malleolus from a fall 2 weeks ago. She is able to bear weight, has mild tenderness over the medial malleolus without any redness or swelling. Her skin abrasion healed up well from last visit.  - continue ice ,elevation, ibuprofen - will get xray to evaluate for any fracture

## 2017-02-21 NOTE — Patient Instructions (Signed)
Will get xray of your ankle to make sure there is no fracture  Continue taking ibuprofen and applying ice or heat, keep elevated.  Will increase lisinopril to 20mg  daily for your blood pressure  Follow up in 1 month.

## 2017-05-31 ENCOUNTER — Other Ambulatory Visit: Payer: Self-pay | Admitting: Physician Assistant

## 2017-05-31 DIAGNOSIS — I10 Essential (primary) hypertension: Secondary | ICD-10-CM

## 2017-06-14 ENCOUNTER — Other Ambulatory Visit: Payer: Self-pay

## 2017-06-14 DIAGNOSIS — I10 Essential (primary) hypertension: Secondary | ICD-10-CM

## 2017-06-14 NOTE — Telephone Encounter (Signed)
lisinopril (PRINIVIL,ZESTRIL) 20 MG tablet     Refill request @ [facility] on [address]

## 2017-06-16 ENCOUNTER — Telehealth: Payer: Self-pay | Admitting: Internal Medicine

## 2017-06-16 DIAGNOSIS — I10 Essential (primary) hypertension: Secondary | ICD-10-CM

## 2017-06-16 MED ORDER — LISINOPRIL 20 MG PO TABS: 20.0000 mg | ORAL_TABLET | Freq: Every day | ORAL | 0 refills | Status: DC

## 2017-06-16 NOTE — Telephone Encounter (Signed)
    Reason for call:   I received a call from [name] at 6:30 PM indicating that she ran out of her blood pressure medication.   Pertinent Data:   She says she ran out of her lisinopril. She says she has not checked her blood pressure recently or even in last week. She requested lisinopril 10 mg as that was listed on her bottle. Prior clinic note says that she was increased to lisinopril 20 mg daily. Says she called on the 19th for refill and was not able to receive it.  Pt is stable- she denies any headaches, dizziness, sob.     Assessment / Plan / Recommendations:   I refilled her lisinopril 20 mg daily  I encouraged her to keep her October 12th appt and explained that she will need labwork.  As always, pt is advised that if symptoms worsen or new symptoms arise, they should go to an urgent care facility or to to ER for further evaluation.   Deneise Lever, MD   06/16/2017, 6:31 PM

## 2017-07-06 ENCOUNTER — Encounter: Payer: Self-pay | Admitting: Internal Medicine

## 2017-07-06 ENCOUNTER — Ambulatory Visit (INDEPENDENT_AMBULATORY_CARE_PROVIDER_SITE_OTHER): Payer: Medicaid Other | Admitting: Internal Medicine

## 2017-07-06 VITALS — BP 154/97 | HR 78 | Temp 98.0°F | Ht 67.0 in | Wt 148.6 lb

## 2017-07-06 DIAGNOSIS — Z79899 Other long term (current) drug therapy: Secondary | ICD-10-CM | POA: Diagnosis not present

## 2017-07-06 DIAGNOSIS — F17210 Nicotine dependence, cigarettes, uncomplicated: Secondary | ICD-10-CM | POA: Diagnosis not present

## 2017-07-06 DIAGNOSIS — I10 Essential (primary) hypertension: Secondary | ICD-10-CM | POA: Diagnosis not present

## 2017-07-06 DIAGNOSIS — Z8249 Family history of ischemic heart disease and other diseases of the circulatory system: Secondary | ICD-10-CM | POA: Diagnosis not present

## 2017-07-06 DIAGNOSIS — Z1159 Encounter for screening for other viral diseases: Secondary | ICD-10-CM

## 2017-07-06 DIAGNOSIS — Z0000 Encounter for general adult medical examination without abnormal findings: Secondary | ICD-10-CM

## 2017-07-06 MED ORDER — LISINOPRIL 20 MG PO TABS: 20.0000 mg | ORAL_TABLET | Freq: Every day | ORAL | 11 refills | Status: DC

## 2017-07-06 NOTE — Progress Notes (Signed)
   CC: Follow up for essential hypertension  HPI:  [name] is a 56 y.o. with history noted below the presents of internal medicine clinic for follow-up on essential hypertension. Please see problem based charting for the status of patient's chronic medical conditions.  Past Medical History:  Diagnosis Date  . Allergy    Claritin  year round  . Anemia   . Asthma    no hospitalizations; one ED visit per year; Albuterol use once per week.  . Hypertension 11/24/2012  . Lupus     Review of Systems:  Review of Systems  Respiratory: Negative for shortness of breath.   Cardiovascular: Negative for chest pain.  Neurological: Negative for dizziness and headaches.     Physical Exam:  Vitals:   07/06/17 1419  BP: (!) 154/97  Pulse: 78  Temp: 98 F (36.7 C)  TempSrc: Oral  SpO2: 99%  Weight: 148 lb 9.6 oz (67.4 kg)  Height:  (1.702 m)   Physical Exam  Constitutional: She is well-developed, well-nourished, and in no distress.  Cardiovascular: Normal rate, regular rhythm and normal heart sounds.  Exam reveals no gallop and no friction rub.   No murmur heard. Pulmonary/Chest: Effort normal and breath sounds normal. No respiratory distress. She has no wheezes. She has no rales.    Assessment & Plan:   See encounters tab for problem based medical decision making.   Patient discussed with Dr. Heide Spark

## 2017-07-06 NOTE — Patient Instructions (Signed)
Patricia Romero,  It was a pleasure meeting you today. Please follow up in 3 months.

## 2017-07-07 LAB — BMP8+ANION GAP
Anion Gap: 16 mmol/L (ref 10.0–18.0)
BUN/Creatinine Ratio: 14 (ref 9–23)
BUN: 13 mg/dL (ref 6–24)
CO2: 25 mmol/L (ref 20–29)
Calcium: 10.2 mg/dL (ref 8.7–10.2)
Chloride: 104 mmol/L (ref 96–106)
Creatinine, Ser: 0.93 mg/dL (ref 0.57–1.00)
GFR calc Af Amer: 81 mL/min/{1.73_m2} (ref >59)
GFR calc non Af Amer: 70 mL/min/{1.73_m2} (ref >59)
Glucose: 85 mg/dL (ref 65–99)
Potassium: 4.6 mmol/L (ref 3.5–5.2)
SODIUM: 145 mmol/L — AB (ref 134–144)

## 2017-07-07 LAB — HEPATITIS C ANTIBODY

## 2017-07-09 NOTE — Assessment & Plan Note (Signed)
Assessment: Essential hypertension Patient currently takes lisinopril 20 mg daily. Her previous clinic visit on 01/2017 her lisinopril was increased from 10 mg to 20 mg due to uncontrolled blood pressure readings.  Today her blood pressure is 154/97 still uncontrolled and not at goal of less than 140/80. Discussed increasing lisinopril versus adding another agent. Patient states that she does not want to increase her lisinopril or add another agent.  She states that she would like to reevaluate at next clinic visit.  Will obtain bmet today due to increase in lisinopril on previous clinic visit.  Plan -bmet -re-assess BP on next visit

## 2017-07-09 NOTE — Assessment & Plan Note (Addendum)
Assessment: Healthcare maintenance  Offered flu vaccine. Patient declined. Will obtain hep c antibody during blood work today.  Discussed with patient and was agreeable. Discussed colon cancer screening with colonoscopy.  Patient states that she has already had a colonoscopy.  She can not remember the name of the group or where she had it done.  Asked to bring information at next visit.  Plan -hep c antibody

## 2017-07-19 NOTE — Progress Notes (Signed)
Internal Medicine Clinic Attending  Case discussed with Dr. Hoffman at the time of the visit.  We reviewed the resident's history and exam and pertinent patient test results.  I agree with the assessment, diagnosis, and plan of care documented in the resident's note.  

## 2017-10-19 ENCOUNTER — Telehealth: Payer: Self-pay | Admitting: *Deleted

## 2017-10-19 NOTE — Telephone Encounter (Signed)
Pt states she has been exposed to RSV (respiratroy syncytial virus) and since she has lupus , wants to know if she's at risk? States has newborn in the house and was dx with RSV recently. States she does not have any symptoms i.e coughing fever, sneezing. Thanks

## 2017-10-21 NOTE — Telephone Encounter (Signed)
Her risk is increased since she has asthma although hers looks to be mild.  She should be fine.  Please let her know to call the clinic if she starts developing symptoms.  What is she taking for her lupus?

## 2017-10-23 NOTE — Telephone Encounter (Signed)
thanks

## 2017-10-23 NOTE — Telephone Encounter (Signed)
Talked to pt - states she feels ok; has year round allergies so she's always sneezing, coughing. Informed to call if develops any other symptoms. Stated she takes Plaquenil for lupus. Only concern is patchy hair loss. She has an appt on 2/14.

## 2017-11-06 ENCOUNTER — Other Ambulatory Visit: Payer: Self-pay | Admitting: *Deleted

## 2017-11-06 DIAGNOSIS — M329 Systemic lupus erythematosus, unspecified: Secondary | ICD-10-CM

## 2017-11-06 NOTE — Progress Notes (Signed)
   CC: Follow-up on essential hypertension  HPI:  [name] is a 57 y.o. female with history noted below that presents to the internal medicine clinic for follow-up on essential hypertension. Please see problem based charting for the status of patient's chronic medical conditions.  Past Medical History:  Diagnosis Date  . Allergy    Claritin  year round  . Anemia   . Asthma    no hospitalizations; one ED visit per year; Albuterol use once per week.  . Hypertension 11/24/2012  . Lupus     Review of Systems:  Review of Systems  Respiratory: Negative for shortness of breath.   Cardiovascular: Negative for chest pain and leg swelling.  Musculoskeletal: Positive for joint pain.     Physical Exam:  Vitals:   11/09/17 1352 11/09/17 1426  BP: (!) 171/104 (!) 162/100  Pulse: 75   Temp: 98.3 F (36.8 C)   TempSrc: Oral   SpO2: 100%   Weight: 150 lb 1.6 oz (68.1 kg)   Height: 5\' 7"  (1.702 m)    Physical Exam  Constitutional: She is well-developed, well-nourished, and in no distress.  Cardiovascular: Normal rate, regular rhythm and normal heart sounds. Exam reveals no gallop and no friction rub.  No murmur heard. Pulmonary/Chest: Breath sounds normal. No respiratory distress. She has no wheezes. She has no rales.  Musculoskeletal: She exhibits no edema.  Skin: Skin is warm and dry. No rash noted. No erythema.    Assessment & Plan:   See encounters tab for problem based medical decision making.   Patient discussed with Dr. Criselda PeachesMullen

## 2017-11-06 NOTE — Telephone Encounter (Signed)
Next appt scheduled 2/14 with PCP. 

## 2017-11-09 ENCOUNTER — Encounter: Payer: Self-pay | Admitting: Internal Medicine

## 2017-11-09 ENCOUNTER — Ambulatory Visit: Payer: Medicaid Other | Admitting: Internal Medicine

## 2017-11-09 VITALS — BP 162/100 | HR 75 | Temp 98.3°F | Ht 67.0 in | Wt 150.1 lb

## 2017-11-09 DIAGNOSIS — I10 Essential (primary) hypertension: Secondary | ICD-10-CM

## 2017-11-09 DIAGNOSIS — M25541 Pain in joints of right hand: Secondary | ICD-10-CM | POA: Diagnosis not present

## 2017-11-09 DIAGNOSIS — M25542 Pain in joints of left hand: Secondary | ICD-10-CM | POA: Diagnosis not present

## 2017-11-09 DIAGNOSIS — M25561 Pain in right knee: Secondary | ICD-10-CM

## 2017-11-09 DIAGNOSIS — Z0000 Encounter for general adult medical examination without abnormal findings: Secondary | ICD-10-CM

## 2017-11-09 DIAGNOSIS — M329 Systemic lupus erythematosus, unspecified: Secondary | ICD-10-CM | POA: Diagnosis not present

## 2017-11-09 DIAGNOSIS — Z8739 Personal history of other diseases of the musculoskeletal system and connective tissue: Secondary | ICD-10-CM

## 2017-11-09 DIAGNOSIS — Z79899 Other long term (current) drug therapy: Secondary | ICD-10-CM

## 2017-11-09 DIAGNOSIS — M25562 Pain in left knee: Secondary | ICD-10-CM

## 2017-11-09 MED ORDER — LISINOPRIL-HYDROCHLOROTHIAZIDE 20-12.5 MG PO TABS: 1.0000 | ORAL_TABLET | Freq: Every day | ORAL | 2 refills | Status: DC

## 2017-11-09 NOTE — Patient Instructions (Signed)
[name],  It was a pleasure seeing you today.  Please start taking your new blood pressure medication.  Please follow up in 3 months.

## 2017-11-10 LAB — URINALYSIS, ROUTINE W REFLEX MICROSCOPIC
BILIRUBIN UA: NEGATIVE
Glucose, UA: NEGATIVE
Ketones, UA: NEGATIVE
Leukocytes, UA: NEGATIVE
Nitrite, UA: NEGATIVE
PH UA: 6 (ref 5.0–7.5)
PROTEIN UA: NEGATIVE
RBC UA: NEGATIVE
Specific Gravity, UA: 1.021 (ref 1.005–1.030)
Urobilinogen, Ur: 0.2 mg/dL (ref 0.2–1.0)

## 2017-11-10 LAB — PROTEIN / CREATININE RATIO, URINE
CREATININE, UR: 138.8 mg/dL
PROTEIN UR: 9.1 mg/dL
Protein/Creat Ratio: 66 mg/g creat (ref 0–200)

## 2017-11-10 NOTE — Progress Notes (Signed)
Internal Medicine Clinic Attending  Case discussed with Dr. Hoffman at the time of the visit.  We reviewed the resident's history and exam and pertinent patient test results.  I agree with the assessment, diagnosis, and plan of care documented in the resident's note.  

## 2017-11-10 NOTE — Assessment & Plan Note (Signed)
Assessment:  History of Lupus Per patient she has a hx of lupus first diagnosed 2013 per patient by dermatology. Patient reports a rash on her head that was biopsied by dermatology.  Asked for record release today.  Only other symptom patient has is joint pain in her hand and knees.  No swelling or erythema on exam.   Patient states she takes plaquenil daily although not refilled for the past 6 months.  Do not see records of lupus in the chart or ANA.  I question if this is even a correct diagnosis.  Will obtain CBC w/diff, UA, ANA, antiphosopholipid antibody, ESR and CRP, Anti-dsDNA and anti-Sm antibodies, and RF.    Plan - CBC w/diff, UA, ANA, antiphosopholipid antibody, ESR and CRP, Anti-dsDNA and anti-Sm antibodies, and RF. - record release from dermatology

## 2017-11-10 NOTE — Assessment & Plan Note (Signed)
Assessment: Essential hypertension Patient currently takes lisinopril 20 mg daily.  At previous clinic visit on 06/2017 blood pressure was elevated at 154/97, patient declined increasing lisinopril or adding another blood pressure medication at that time.  Today blood pressure is elevated at 171/104 and on repeat 162/100.  Patient agreeable to take another medication.  Will add HCTZ 12.5mg   Plan -continue lisinopril 20mg  -add HCTZ 12.5mg  -BMET

## 2017-11-10 NOTE — Assessment & Plan Note (Signed)
Assessment: Healthcare maintenance Patient is due for colonoscopy. Discussed colon cancer screening with colonoscopy first fit test. Patient did not want to do colonoscopy and opted for fit test. She understands that if positive will need a colonoscopy.  Plan -Fit test

## 2017-11-13 ENCOUNTER — Other Ambulatory Visit: Payer: Medicaid Other

## 2017-11-13 DIAGNOSIS — Z0000 Encounter for general adult medical examination without abnormal findings: Secondary | ICD-10-CM

## 2017-11-15 LAB — FECAL OCCULT BLOOD, IMMUNOCHEMICAL: Fecal Occult Bld: NEGATIVE

## 2017-11-16 LAB — ANTIPHOSPHOLIPID SYNDROME COMP
ANTIPHOSPHATIDYLSERINE IGG: 4 {GPS'U}
APTT: 27.9 s
Anticardiolipin Ab, IgA: <10 [APL'U]
Antiphosphatidylserine IgM: 0 {MPS'U}
Antiprothrombin Antibody, IgG: 5 G units
DRVVT Screen Seconds: 36.9 s
Hexagonal Phospholipid Neutral: 0 s
PLATELET NEUTRALIZATION: 0 s

## 2017-11-16 LAB — CBC WITH DIFFERENTIAL/PLATELET
BASOS: 0 %
Basophils Absolute: 0 10*3/uL (ref 0.0–0.2)
EOS (ABSOLUTE): 0.1 10*3/uL (ref 0.0–0.4)
EOS: 2 %
HEMATOCRIT: 36.2 % (ref 34.0–46.6)
HEMOGLOBIN: 12.1 g/dL (ref 11.1–15.9)
IMMATURE GRANS (ABS): 0 10*3/uL (ref 0.0–0.1)
IMMATURE GRANULOCYTES: 0 %
Lymphocytes Absolute: 1.5 10*3/uL (ref 0.7–3.1)
Lymphs: 45 %
MCH: 27.4 pg (ref 26.6–33.0)
MCHC: 33.4 g/dL (ref 31.5–35.7)
MCV: 82 fL (ref 79–97)
MONOCYTES: 6 %
MONOS ABS: 0.2 10*3/uL (ref 0.1–0.9)
NEUTROS PCT: 47 %
Neutrophils Absolute: 1.6 10*3/uL (ref 1.4–7.0)
Platelets: 292 10*3/uL (ref 150–379)
RBC: 4.41 x10E6/uL (ref 3.77–5.28)
RDW: 14.3 % (ref 12.3–15.4)
WBC: 3.4 10*3/uL (ref 3.4–10.8)

## 2017-11-16 LAB — ANTI-SMITH ANTIBODY: ENA SM Ab Ser-aCnc: <0.2 AI (ref 0.0–0.9)

## 2017-11-16 LAB — BMP8+ANION GAP
Anion Gap: 18 mmol/L (ref 10.0–18.0)
BUN/Creatinine Ratio: 13 (ref 9–23)
BUN: 13 mg/dL (ref 6–24)
CO2: 21 mmol/L (ref 20–29)
CREATININE: 1.01 mg/dL — AB (ref 0.57–1.00)
Calcium: 9.6 mg/dL (ref 8.7–10.2)
Chloride: 104 mmol/L (ref 96–106)
GFR calc Af Amer: 73 mL/min/{1.73_m2} (ref >59)
GFR, EST NON AFRICAN AMERICAN: 63 mL/min/{1.73_m2} (ref >59)
Glucose: 90 mg/dL (ref 65–99)
Potassium: 4 mmol/L (ref 3.5–5.2)
Sodium: 143 mmol/L (ref 134–144)

## 2017-11-16 LAB — ANTI-DNA ANTIBODY, DOUBLE-STRANDED: dsDNA Ab: <1 IU/mL (ref 0–9)

## 2017-11-16 LAB — ANTINUCLEAR ANTIBODIES, IFA: ANA Titer 1: NEGATIVE

## 2017-11-16 LAB — SEDIMENTATION RATE: Sed Rate: 29 mm/hr (ref 0–40)

## 2017-12-05 ENCOUNTER — Other Ambulatory Visit: Payer: Self-pay

## 2017-12-05 DIAGNOSIS — M329 Systemic lupus erythematosus, unspecified: Secondary | ICD-10-CM

## 2017-12-05 NOTE — Telephone Encounter (Signed)
hydroxychloroquine (PLAQUENIL) 200 MG tablet, Refill request @ [facility] on [address].

## 2017-12-08 ENCOUNTER — Other Ambulatory Visit: Payer: Self-pay | Admitting: Internal Medicine

## 2017-12-08 ENCOUNTER — Telehealth: Payer: Self-pay | Admitting: Internal Medicine

## 2017-12-08 DIAGNOSIS — M329 Systemic lupus erythematosus, unspecified: Secondary | ICD-10-CM

## 2017-12-08 NOTE — Telephone Encounter (Signed)
Discussed results with Ms. Mistry.  Labs did not appear consistent with Lupus.  She states her joints are hurting her still.  I offered a referral to rheumatology and she was agreeable

## 2017-12-11 ENCOUNTER — Other Ambulatory Visit: Payer: Self-pay | Admitting: Internal Medicine

## 2017-12-11 NOTE — Telephone Encounter (Signed)
If the plaquenil has been d'cd for pt please remove from medlist, thanks

## 2017-12-11 NOTE — Telephone Encounter (Signed)
removed

## 2018-02-13 NOTE — Progress Notes (Signed)
   CC: Follow up on essential hypertension   HPI:  Patricia Romero is a 57 y.o. female with history noted below that presents to the [facility] for follow-up on essential hypertension. Please see problem based charting for the status of patient's chronic medical conditions.  Past Medical History:  Diagnosis Date  . Allergy    Claritin  year round  . Anemia   . Asthma    no hospitalizations; one ED visit per year; Albuterol use once per week.  . Hypertension 11/24/2012  . Lupus (HCC)     Review of Systems:  Review of Systems  Constitutional: Positive for malaise/fatigue.  HENT: Negative for ear discharge, ear pain, hearing loss and tinnitus.   Respiratory: Negative for shortness of breath.   Cardiovascular: Negative for chest pain.     Physical Exam:  Vitals:   02/15/18 1418  BP: 130/83  Pulse: 68  Temp: 98.2 F (36.8 C)  TempSrc: Oral  SpO2: 99%  Weight: 149 lb 1.6 oz (67.6 kg)  Height:  (1.702 m)   Physical Exam  Constitutional: She is well-developed, well-nourished, and in no distress.  HENT:  Right Ear: External ear normal.  Left Ear: External ear normal.  Cerumen noted in ears bilaterally  Cardiovascular: Normal rate, regular rhythm and normal heart sounds. Exam reveals no gallop and no friction rub.  No murmur heard. Pulmonary/Chest: Effort normal and breath sounds normal. No respiratory distress. She has no wheezes. She has no rales.    Assessment & Plan:   See encounters tab for problem based medical decision making.   Patient discussed with Dr. Heide Spark

## 2018-02-15 ENCOUNTER — Other Ambulatory Visit: Payer: Self-pay

## 2018-02-15 ENCOUNTER — Encounter: Payer: Self-pay | Admitting: Internal Medicine

## 2018-02-15 ENCOUNTER — Ambulatory Visit: Payer: Medicaid Other | Admitting: Internal Medicine

## 2018-02-15 VITALS — BP 130/83 | HR 68 | Temp 98.2°F | Ht 67.0 in | Wt 149.1 lb

## 2018-02-15 DIAGNOSIS — H6123 Impacted cerumen, bilateral: Secondary | ICD-10-CM | POA: Diagnosis not present

## 2018-02-15 DIAGNOSIS — R5383 Other fatigue: Secondary | ICD-10-CM | POA: Diagnosis not present

## 2018-02-15 DIAGNOSIS — I10 Essential (primary) hypertension: Secondary | ICD-10-CM | POA: Diagnosis not present

## 2018-02-15 DIAGNOSIS — R5381 Other malaise: Secondary | ICD-10-CM | POA: Diagnosis not present

## 2018-02-15 DIAGNOSIS — Z79899 Other long term (current) drug therapy: Secondary | ICD-10-CM

## 2018-02-15 DIAGNOSIS — Z23 Encounter for immunization: Secondary | ICD-10-CM | POA: Diagnosis not present

## 2018-02-15 DIAGNOSIS — Z0000 Encounter for general adult medical examination without abnormal findings: Secondary | ICD-10-CM

## 2018-02-15 NOTE — Patient Instructions (Signed)
Patricia Romero,  It was a pleasure seeing you today. Please follow up in 6 months or sooner if needed.

## 2018-02-17 DIAGNOSIS — H6123 Impacted cerumen, bilateral: Secondary | ICD-10-CM | POA: Insufficient documentation

## 2018-02-17 NOTE — Assessment & Plan Note (Signed)
Assessment:  Bilateral Cerumen Patient states that she feels that her hearing is muffled on the right side.  On exam patient had earwax on both sides but more on the right.  Ear wax clearing was attempted in office, however patient became dizzy during the procedure and unable to clear the ear wax.  I suggested over the counter ear wax removal drops.  If this does not help will refer to ENT.  Plan - Ear wax removal drops - if does not help with symptoms will refer to ENT

## 2018-02-17 NOTE — Assessment & Plan Note (Signed)
Assessment:  Essential hypertension Patient currently takes lisinopril  daily and HCTZ 12.5mg  daily.  HCTZ was added at last clinic visit on 11/10/17.  Patient reports adherence to both medications.  Today's blood pressure is 130/83, stable and at goal.  Patient had blood work done recently including today so will not check a bmet in office.  On 5/17 patients K was 4.6 and creatinine 0.99.  Plan -continue lisinopril  -HCTZ 12.5mg 

## 2018-02-17 NOTE — Assessment & Plan Note (Signed)
Assessment:  Healthcare maintenance Patient is due for Tdap vaccine and this was offered in office and patient accepted.    Plan - Tdap vaccine given in office

## 2018-02-21 NOTE — Progress Notes (Signed)
Internal Medicine Clinic Attending  Case discussed with Dr. Hoffman at the time of the visit.  We reviewed the resident's history and exam and pertinent patient test results.  I agree with the assessment, diagnosis, and plan of care documented in the resident's note.  

## 2018-04-17 ENCOUNTER — Other Ambulatory Visit: Payer: Self-pay | Admitting: Internal Medicine

## 2019-01-24 ENCOUNTER — Other Ambulatory Visit: Payer: Self-pay

## 2019-01-24 ENCOUNTER — Encounter: Payer: Medicare Other | Admitting: Internal Medicine

## 2019-01-24 ENCOUNTER — Telehealth: Payer: Self-pay | Admitting: Internal Medicine

## 2019-01-24 NOTE — Telephone Encounter (Signed)
Called patient today for tele-health visit with no answer.  Will leave message for front desk to follow up with patient.

## 2019-03-25 ENCOUNTER — Encounter: Payer: Self-pay | Admitting: *Deleted

## 2019-05-31 ENCOUNTER — Other Ambulatory Visit: Payer: Self-pay | Admitting: Family Medicine

## 2019-05-31 DIAGNOSIS — Z1231 Encounter for screening mammogram for malignant neoplasm of breast: Secondary | ICD-10-CM

## 2019-06-15 ENCOUNTER — Other Ambulatory Visit: Payer: Self-pay

## 2019-06-15 DIAGNOSIS — Z20822 Contact with and (suspected) exposure to covid-19: Secondary | ICD-10-CM

## 2019-06-16 ENCOUNTER — Emergency Department (HOSPITAL_COMMUNITY): Payer: Medicare Other

## 2019-06-16 ENCOUNTER — Emergency Department (HOSPITAL_COMMUNITY)
Admission: EM | Admit: 2019-06-16 | Discharge: 2019-06-16 | Disposition: A | Discharge status: Home / Self Care | Payer: Medicare Other | Attending: Emergency Medicine | Admitting: Emergency Medicine

## 2019-06-16 ENCOUNTER — Other Ambulatory Visit: Payer: Self-pay

## 2019-06-16 ENCOUNTER — Encounter (HOSPITAL_COMMUNITY): Payer: Self-pay | Admitting: Emergency Medicine

## 2019-06-16 DIAGNOSIS — Y999 Unspecified external cause status: Secondary | ICD-10-CM | POA: Diagnosis not present

## 2019-06-16 DIAGNOSIS — J45909 Unspecified asthma, uncomplicated: Secondary | ICD-10-CM | POA: Diagnosis not present

## 2019-06-16 DIAGNOSIS — S91114A Laceration without foreign body of right lesser toe(s) without damage to nail, initial encounter: Secondary | ICD-10-CM | POA: Insufficient documentation

## 2019-06-16 DIAGNOSIS — F17210 Nicotine dependence, cigarettes, uncomplicated: Secondary | ICD-10-CM | POA: Insufficient documentation

## 2019-06-16 DIAGNOSIS — W2203XA Walked into furniture, initial encounter: Secondary | ICD-10-CM | POA: Diagnosis not present

## 2019-06-16 DIAGNOSIS — Y939 Activity, unspecified: Secondary | ICD-10-CM | POA: Diagnosis not present

## 2019-06-16 DIAGNOSIS — S99921A Unspecified injury of right foot, initial encounter: Secondary | ICD-10-CM | POA: Diagnosis present

## 2019-06-16 DIAGNOSIS — I10 Essential (primary) hypertension: Secondary | ICD-10-CM | POA: Diagnosis not present

## 2019-06-16 DIAGNOSIS — Y92003 Bedroom of unspecified non-institutional (private) residence as the place of occurrence of the external cause: Secondary | ICD-10-CM | POA: Diagnosis not present

## 2019-06-16 LAB — NOVEL CORONAVIRUS, NAA: SARS-CoV-2, NAA: NOT DETECTED

## 2019-06-16 MED ORDER — LIDOCAINE HCL (PF) 1 % IJ SOLN
10.0000 mL | Freq: Once | INTRAMUSCULAR | Status: AC
  Administered 2019-06-16: 17:00:00 10 mL
  Filled 2019-06-16: qty 30

## 2019-06-16 MED ORDER — DOXYCYCLINE HYCLATE 100 MG PO CAPS: 100.0000 mg | ORAL_CAPSULE | Freq: Two times a day (BID) | ORAL | 0 refills | Status: DC
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
## 2019-06-16 NOTE — Discharge Instructions (Signed)
You will need to have your sutures removed in 7 to 10 days, this can be done in the emergency department or with your primary care doctor.  Please take antibiotics to prevent infection.  Keep the wound clean and dry, change dressing at least once daily and keep a close eye on your foot, monitor for any signs of infection such as redness, swelling, drainage or increasing pain if these occur return to the ED or see your primary care doctor immediately.

## 2019-06-16 NOTE — ED Notes (Signed)
Patient visualized walking out of the department with belongings.

## 2019-06-16 NOTE — ED Triage Notes (Signed)
Patient reports cutting right fourth toe while cleaning the house. Approximately one half inch laceration to toe. Bleeding controlled.

## 2019-06-16 NOTE — ED Notes (Signed)
Patient back in room

## 2019-07-18 ENCOUNTER — Ambulatory Visit
Admission: RE | Admit: 2019-07-18 | Discharge: 2019-07-18 | Disposition: A | Discharge status: Home / Self Care | Payer: Medicare Other | Source: Ambulatory Visit | Attending: Family Medicine | Admitting: Family Medicine

## 2019-07-18 ENCOUNTER — Other Ambulatory Visit: Payer: Self-pay

## 2019-07-18 DIAGNOSIS — Z1231 Encounter for screening mammogram for malignant neoplasm of breast: Secondary | ICD-10-CM

## 2019-07-21 ENCOUNTER — Other Ambulatory Visit: Payer: Self-pay

## 2019-07-21 ENCOUNTER — Emergency Department (HOSPITAL_COMMUNITY)
Admission: EM | Admit: 2019-07-21 | Discharge: 2019-07-21 | Disposition: A | Discharge status: Home / Self Care | Payer: Medicare Other | Attending: Emergency Medicine | Admitting: Emergency Medicine

## 2019-07-21 ENCOUNTER — Emergency Department (HOSPITAL_COMMUNITY): Payer: Medicare Other

## 2019-07-21 ENCOUNTER — Encounter (HOSPITAL_COMMUNITY): Payer: Self-pay

## 2019-07-21 DIAGNOSIS — F17210 Nicotine dependence, cigarettes, uncomplicated: Secondary | ICD-10-CM | POA: Insufficient documentation

## 2019-07-21 DIAGNOSIS — J45909 Unspecified asthma, uncomplicated: Secondary | ICD-10-CM | POA: Insufficient documentation

## 2019-07-21 DIAGNOSIS — R1032 Left lower quadrant pain: Secondary | ICD-10-CM

## 2019-07-21 DIAGNOSIS — Z79899 Other long term (current) drug therapy: Secondary | ICD-10-CM | POA: Diagnosis not present

## 2019-07-21 DIAGNOSIS — I10 Essential (primary) hypertension: Secondary | ICD-10-CM | POA: Insufficient documentation

## 2019-07-21 LAB — COMPREHENSIVE METABOLIC PANEL
ALT: 113 U/L — ABNORMAL HIGH (ref 0–44)
AST: 55 U/L — ABNORMAL HIGH (ref 15–41)
Albumin: 4 g/dL (ref 3.5–5.0)
Alkaline Phosphatase: 72 U/L (ref 38–126)
Anion gap: 9 (ref 5–15)
BUN: 17 mg/dL (ref 6–20)
CO2: 26 mmol/L (ref 22–32)
Calcium: 9.2 mg/dL (ref 8.9–10.3)
Chloride: 106 mmol/L (ref 98–111)
Creatinine, Ser: 0.79 mg/dL (ref 0.44–1.00)
GFR calc Af Amer: >60 mL/min (ref >60)
GFR calc non Af Amer: >60 mL/min (ref >60)
Glucose, Bld: 97 mg/dL (ref 70–99)
Potassium: 3.1 mmol/L — ABNORMAL LOW (ref 3.5–5.1)
Sodium: 141 mmol/L (ref 135–145)
Total Bilirubin: 0.4 mg/dL (ref 0.3–1.2)
Total Protein: 7 g/dL (ref 6.5–8.1)

## 2019-07-21 LAB — CBC WITH DIFFERENTIAL/PLATELET
Abs Immature Granulocytes: 0.01 10*3/uL (ref 0.00–0.07)
Basophils Absolute: 0 10*3/uL (ref 0.0–0.1)
Basophils Relative: 1 %
Eosinophils Absolute: 0 10*3/uL (ref 0.0–0.5)
Eosinophils Relative: 1 %
HCT: 35.2 % — ABNORMAL LOW (ref 36.0–46.0)
Hemoglobin: 11 g/dL — ABNORMAL LOW (ref 12.0–15.0)
Immature Granulocytes: 0 %
Lymphocytes Relative: 47 %
Lymphs Abs: 1.6 10*3/uL (ref 0.7–4.0)
MCH: 27.1 pg (ref 26.0–34.0)
MCHC: 31.3 g/dL (ref 30.0–36.0)
MCV: 86.7 fL (ref 80.0–100.0)
Monocytes Absolute: 0.2 10*3/uL (ref 0.1–1.0)
Monocytes Relative: 7 %
Neutro Abs: 1.5 10*3/uL — ABNORMAL LOW (ref 1.7–7.7)
Neutrophils Relative %: 44 %
Platelets: 265 10*3/uL (ref 150–400)
RBC: 4.06 MIL/uL (ref 3.87–5.11)
RDW: 13.8 % (ref 11.5–15.5)
WBC: 3.4 10*3/uL — ABNORMAL LOW (ref 4.0–10.5)
nRBC: 0 % (ref 0.0–0.2)

## 2019-07-21 LAB — URINALYSIS, ROUTINE W REFLEX MICROSCOPIC
Bacteria, UA: NONE SEEN
Bilirubin Urine: NEGATIVE
Glucose, UA: NEGATIVE mg/dL
Hgb urine dipstick: NEGATIVE
Ketones, ur: 5 mg/dL — AB
Leukocytes,Ua: NEGATIVE
Nitrite: NEGATIVE
Protein, ur: 30 mg/dL — AB
Specific Gravity, Urine: 1.031 — ABNORMAL HIGH (ref 1.005–1.030)
pH: 5 (ref 5.0–8.0)

## 2019-07-21 LAB — LIPASE, BLOOD: Lipase: 122 U/L — ABNORMAL HIGH (ref 11–51)

## 2019-07-21 MED ORDER — IOHEXOL 300 MG/ML  SOLN
100.0000 mL | Freq: Once | INTRAMUSCULAR | Status: AC | PRN
  Administered 2019-07-21: 20:00:00 100 mL via INTRAVENOUS

## 2019-07-21 MED ORDER — SODIUM CHLORIDE 0.9 % IV BOLUS
1000.0000 mL | Freq: Once | INTRAVENOUS | Status: AC
  Administered 2019-07-21: 1000 mL via INTRAVENOUS

## 2019-07-21 MED ORDER — SODIUM CHLORIDE (PF) 0.9 % IJ SOLN
INTRAMUSCULAR | Status: AC
  Filled 2019-07-21: qty 50

## 2019-07-21 NOTE — ED Notes (Signed)
Pt ambulated independently to the bathroom 
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
## 2019-07-21 NOTE — ED Triage Notes (Signed)
Pt states she is having left sided flank/abd pain. Pt states that she had been seen by a GI dr and had a colonoscopy and was waiting for more information. Told to come here by GI.

## 2019-07-21 NOTE — Discharge Instructions (Addendum)
Return here as needed.  Follow-up with your doctor as soon as possible for a recheck and further evaluation.  I would suggest a clear liquid diet over the next 24 to 48 hours.

## 2019-09-09 ENCOUNTER — Ambulatory Visit: Payer: Medicare Other

## 2020-03-01 ENCOUNTER — Encounter (HOSPITAL_COMMUNITY): Payer: Self-pay | Admitting: Emergency Medicine

## 2020-03-01 ENCOUNTER — Emergency Department (HOSPITAL_COMMUNITY): Payer: Medicare Other

## 2020-03-01 ENCOUNTER — Emergency Department (HOSPITAL_COMMUNITY)
Admission: EM | Admit: 2020-03-01 | Discharge: 2020-03-01 | Disposition: A | Discharge status: Home / Self Care | Payer: Medicare Other | Attending: Emergency Medicine | Admitting: Emergency Medicine

## 2020-03-01 ENCOUNTER — Other Ambulatory Visit: Payer: Self-pay

## 2020-03-01 DIAGNOSIS — Z20822 Contact with and (suspected) exposure to covid-19: Secondary | ICD-10-CM | POA: Insufficient documentation

## 2020-03-01 DIAGNOSIS — F17210 Nicotine dependence, cigarettes, uncomplicated: Secondary | ICD-10-CM | POA: Diagnosis not present

## 2020-03-01 DIAGNOSIS — Z79899 Other long term (current) drug therapy: Secondary | ICD-10-CM | POA: Insufficient documentation

## 2020-03-01 DIAGNOSIS — J45909 Unspecified asthma, uncomplicated: Secondary | ICD-10-CM | POA: Diagnosis not present

## 2020-03-01 DIAGNOSIS — R1032 Left lower quadrant pain: Secondary | ICD-10-CM | POA: Diagnosis not present

## 2020-03-01 DIAGNOSIS — R112 Nausea with vomiting, unspecified: Secondary | ICD-10-CM | POA: Insufficient documentation

## 2020-03-01 DIAGNOSIS — R109 Unspecified abdominal pain: Secondary | ICD-10-CM | POA: Diagnosis present

## 2020-03-01 LAB — LIPASE, BLOOD: Lipase: 53 U/L — ABNORMAL HIGH (ref 11–51)

## 2020-03-01 LAB — COMPREHENSIVE METABOLIC PANEL
ALT: 59 U/L — ABNORMAL HIGH (ref 0–44)
AST: 28 U/L (ref 15–41)
Albumin: 4.6 g/dL (ref 3.5–5.0)
Alkaline Phosphatase: 68 U/L (ref 38–126)
Anion gap: 11 (ref 5–15)
BUN: 22 mg/dL — ABNORMAL HIGH (ref 6–20)
CO2: 24 mmol/L (ref 22–32)
Calcium: 9.5 mg/dL (ref 8.9–10.3)
Chloride: 102 mmol/L (ref 98–111)
Creatinine, Ser: 0.84 mg/dL (ref 0.44–1.00)
GFR calc Af Amer: >60 mL/min (ref >60)
GFR calc non Af Amer: >60 mL/min (ref >60)
Glucose, Bld: 94 mg/dL (ref 70–99)
Potassium: 4.3 mmol/L (ref 3.5–5.1)
Sodium: 137 mmol/L (ref 135–145)
Total Bilirubin: 0.9 mg/dL (ref 0.3–1.2)
Total Protein: 7.9 g/dL (ref 6.5–8.1)

## 2020-03-01 LAB — CBC
HCT: 43.4 % (ref 36.0–46.0)
Hemoglobin: 13.7 g/dL (ref 12.0–15.0)
MCH: 27.3 pg (ref 26.0–34.0)
MCHC: 31.6 g/dL (ref 30.0–36.0)
MCV: 86.6 fL (ref 80.0–100.0)
Platelets: 281 10*3/uL (ref 150–400)
RBC: 5.01 MIL/uL (ref 3.87–5.11)
RDW: 13.2 % (ref 11.5–15.5)
WBC: 5.8 10*3/uL (ref 4.0–10.5)
nRBC: 0 % (ref 0.0–0.2)

## 2020-03-01 LAB — URINALYSIS, ROUTINE W REFLEX MICROSCOPIC
Bilirubin Urine: NEGATIVE
Glucose, UA: NEGATIVE mg/dL
Hgb urine dipstick: NEGATIVE
Ketones, ur: NEGATIVE mg/dL
Leukocytes,Ua: NEGATIVE
Nitrite: NEGATIVE
Protein, ur: NEGATIVE mg/dL
Specific Gravity, Urine: 1.027 (ref 1.005–1.030)
pH: 5 (ref 5.0–8.0)

## 2020-03-01 LAB — SARS CORONAVIRUS 2 BY RT PCR (HOSPITAL ORDER, PERFORMED IN ~~LOC~~ HOSPITAL LAB): SARS Coronavirus 2: NEGATIVE

## 2020-03-01 MED ORDER — SODIUM CHLORIDE 0.9% FLUSH: 3.0000 mL | Freq: Once | INTRAVENOUS | Status: DC

## 2020-03-01 MED ORDER — ONDANSETRON HCL 4 MG/2ML IJ SOLN
4.0000 mg | Freq: Once | INTRAMUSCULAR | Status: DC
  Filled 2020-03-01: qty 2

## 2020-03-01 NOTE — ED Triage Notes (Signed)
Pt c/o sharp abd pains with n/v since yesterday.

## 2020-03-01 NOTE — ED Notes (Signed)
Attempted IV access in right hand. Was able to obtain blood work, IV infiltrated when flushing.   I-stat beta canceled as patient is post menopausal.  Patient stated she was concerned about Covid d/t her symptoms, wants testing. Notified PA Shirlee Latch

## 2020-03-01 NOTE — Discharge Instructions (Addendum)
Thank you for allowing me to care for you today. Please return to the emergency department if you have new or worsening symptoms. Take your medications as instructed.  ° °

## 2020-03-01 NOTE — ED Notes (Signed)
Discussing CT order with patient. Patient states she cannot take CT contrast, last time she took it, she had issues with breathing. Provider notified.

## 2020-03-01 NOTE — ED Notes (Signed)
Patient refused zofran. Said she is not nauseated or vomiting.
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
## 2020-08-25 ENCOUNTER — Other Ambulatory Visit: Payer: Self-pay | Admitting: Family Medicine

## 2020-08-25 DIAGNOSIS — Z1231 Encounter for screening mammogram for malignant neoplasm of breast: Secondary | ICD-10-CM

## 2020-08-28 ENCOUNTER — Ambulatory Visit
Admission: RE | Admit: 2020-08-28 | Discharge: 2020-08-28 | Disposition: A | Discharge status: Home / Self Care | Payer: Medicare Other | Source: Ambulatory Visit | Attending: Family Medicine | Admitting: Family Medicine

## 2020-08-28 ENCOUNTER — Other Ambulatory Visit: Payer: Self-pay

## 2020-08-28 DIAGNOSIS — Z1231 Encounter for screening mammogram for malignant neoplasm of breast: Secondary | ICD-10-CM

## 2020-12-29 ENCOUNTER — Other Ambulatory Visit: Payer: Self-pay

## 2020-12-29 ENCOUNTER — Ambulatory Visit (INDEPENDENT_AMBULATORY_CARE_PROVIDER_SITE_OTHER): Payer: Medicare Other | Admitting: Podiatry

## 2020-12-29 DIAGNOSIS — E119 Type 2 diabetes mellitus without complications: Secondary | ICD-10-CM | POA: Diagnosis not present

## 2020-12-29 DIAGNOSIS — M2141 Flat foot [pes planus] (acquired), right foot: Secondary | ICD-10-CM

## 2020-12-29 DIAGNOSIS — M2142 Flat foot [pes planus] (acquired), left foot: Secondary | ICD-10-CM

## 2020-12-29 NOTE — Progress Notes (Signed)
  Subjective:  Patient ID: Patricia Romero, female    DOB: 07/05/1961,  MRN: [id number]  Chief Complaint  Patient presents with  . Diabetes    Diabetic foot exam     60 y.o. female presents with the above complaint. History confirmed with patient.  She is doing well she has a history of lupus and her diabetes well controlled at 5.7% A1c she is on Ozempic.  Objective:  Physical Exam: warm, good capillary refill, no trophic changes or ulcerative lesions, normal DP and PT pulses, normal monofilament exam and normal sensory exam. Assessment:   1. Well controlled type 2 diabetes mellitus (HCC)   2. Pes planus of both feet      Plan:  Patient was evaluated and treated and all questions answered.  Patient educated on diabetes. Discussed proper diabetic foot care and discussed risks and complications of disease. Educated patient in depth on reasons to return to the office immediately should he/she discover anything concerning or new on the feet. All questions answered. Discussed proper shoes as well.    Return in about 1 year (around 12/29/2021).

## 2021-08-26 ENCOUNTER — Other Ambulatory Visit: Payer: Self-pay | Admitting: Family Medicine

## 2021-08-26 DIAGNOSIS — Z1231 Encounter for screening mammogram for malignant neoplasm of breast: Secondary | ICD-10-CM

## 2021-09-29 ENCOUNTER — Ambulatory Visit
Admission: RE | Admit: 2021-09-29 | Discharge: 2021-09-29 | Disposition: A | Discharge status: Home / Self Care | Payer: Medicare Other | Source: Ambulatory Visit | Attending: Family Medicine | Admitting: Family Medicine

## 2021-09-29 ENCOUNTER — Other Ambulatory Visit: Payer: Self-pay

## 2021-09-29 DIAGNOSIS — Z1231 Encounter for screening mammogram for malignant neoplasm of breast: Secondary | ICD-10-CM

## 2021-12-30 ENCOUNTER — Ambulatory Visit: Payer: Medicare Other | Admitting: Podiatry

## 2022-01-03 ENCOUNTER — Encounter: Payer: Self-pay | Admitting: Podiatry

## 2022-01-03 ENCOUNTER — Ambulatory Visit (INDEPENDENT_AMBULATORY_CARE_PROVIDER_SITE_OTHER): Payer: Medicare Other | Admitting: Podiatry

## 2022-01-03 DIAGNOSIS — E119 Type 2 diabetes mellitus without complications: Secondary | ICD-10-CM | POA: Insufficient documentation

## 2022-01-03 NOTE — Progress Notes (Signed)
?  Subjective:  ?Patient ID: Patricia Romero, female    DOB: [birth year],  MRN: [id number] ? ?Chief Complaint  ?Patient presents with  ? Diabetes  ?    Annual diabetic foot exam  ? ? ?61 y.o. female presents with the above complaint. History confirmed with patient.  Overall doing well.  Does not recall the exact number of her last A1c but is in the high fives.  No new issues to report ? ?Objective:  ?Physical Exam: ?warm, good capillary refill, no trophic changes or ulcerative lesions, normal DP and PT pulses, normal monofilament exam and normal sensory exam. ?Assessment:  ? ?1. Encounter for diabetic foot exam (HCC)   ?2. Controlled type 2 diabetes mellitus without complication, without long-term current use of insulin (HCC)   ? ? ? ?Plan:  ?Patient was evaluated and treated and all questions answered. ? ?Patient educated on diabetes. Discussed proper diabetic foot care and discussed risks and complications of disease. Educated patient in depth on reasons to return to the office immediately should he/she discover anything concerning or new on the feet. All questions answered. Discussed proper shoes as well.  ? ?Doing well no new issues she continues to have good sensation.  As long as she continues her good glycemic control I do not expect she will have many pedal complications ? ?Return in about 1 year (around 01/04/2023) for diabetic foot exam.  ? ?

## 2022-03-08 ENCOUNTER — Other Ambulatory Visit (HOSPITAL_COMMUNITY): Payer: Self-pay | Admitting: *Deleted

## 2022-03-08 DIAGNOSIS — R072 Precordial pain: Secondary | ICD-10-CM

## 2022-03-08 DIAGNOSIS — R079 Chest pain, unspecified: Secondary | ICD-10-CM

## 2022-03-08 MED ORDER — PREDNISONE 50 MG PO TABS: ORAL_TABLET | ORAL | 0 refills | Status: AC

## 2022-03-08 MED ORDER — METOPROLOL TARTRATE 100 MG PO TABS: ORAL_TABLET | ORAL | 0 refills | Status: AC

## 2022-03-08 MED ORDER — DIPHENHYDRAMINE HCL 50 MG PO CAPS: ORAL_CAPSULE | ORAL | 0 refills | Status: AC

## 2022-03-28 ENCOUNTER — Telehealth (HOSPITAL_COMMUNITY): Payer: Self-pay | Admitting: *Deleted

## 2022-03-28 NOTE — Telephone Encounter (Signed)
Reaching out to patient to offer assistance regarding upcoming cardiac imaging study; pt verbalizes understanding of appt date/time, parking situation and where to check in, pre-test NPO status and medications ordered, and verified current allergies; name and call back number provided for further questions should they arise  Larey Brick RN Navigator Cardiac Imaging [facility] [phone] office [phone] cell  Reviewed with patient how to take 13 hour prep and metoprolol tartrate. She is aware to arrive at 8:30am.

## 2022-03-31 ENCOUNTER — Encounter (HOSPITAL_COMMUNITY): Payer: Self-pay

## 2022-03-31 ENCOUNTER — Ambulatory Visit (HOSPITAL_COMMUNITY)
Admission: RE | Admit: 2022-03-31 | Discharge: 2022-03-31 | Disposition: A | Discharge status: Home / Self Care | Payer: Medicare Other | Source: Ambulatory Visit | Attending: Family Medicine | Admitting: Family Medicine

## 2022-03-31 DIAGNOSIS — R072 Precordial pain: Secondary | ICD-10-CM | POA: Insufficient documentation

## 2022-03-31 DIAGNOSIS — R079 Chest pain, unspecified: Secondary | ICD-10-CM | POA: Diagnosis present

## 2022-03-31 MED ORDER — NITROGLYCERIN 0.4 MG SL SUBL
SUBLINGUAL_TABLET | SUBLINGUAL | Status: AC
  Filled 2022-03-31: qty 2

## 2022-03-31 MED ORDER — IOHEXOL 350 MG/ML SOLN
100.0000 mL | Freq: Once | INTRAVENOUS | Status: AC | PRN
  Administered 2022-03-31: 100 mL via INTRAVENOUS

## 2022-03-31 MED ORDER — NITROGLYCERIN 0.4 MG SL SUBL
0.8000 mg | SUBLINGUAL_TABLET | Freq: Once | SUBLINGUAL | Status: AC
  Administered 2022-03-31: 0.8 mg via SUBLINGUAL

## 2022-04-01 ENCOUNTER — Telehealth (HOSPITAL_COMMUNITY): Payer: Self-pay | Admitting: Emergency Medicine

## 2022-04-01 NOTE — Telephone Encounter (Signed)
error 

## 2022-04-06 ENCOUNTER — Emergency Department (HOSPITAL_COMMUNITY)
Admission: EM | Admit: 2022-04-06 | Discharge: 2022-04-06 | Disposition: A | Discharge status: Home / Self Care | Payer: Medicare Other | Attending: Student | Admitting: Student

## 2022-04-06 ENCOUNTER — Emergency Department (HOSPITAL_COMMUNITY): Payer: Medicare Other

## 2022-04-06 ENCOUNTER — Encounter (HOSPITAL_COMMUNITY): Payer: Self-pay

## 2022-04-06 DIAGNOSIS — M542 Cervicalgia: Secondary | ICD-10-CM | POA: Diagnosis not present

## 2022-04-06 DIAGNOSIS — R519 Headache, unspecified: Secondary | ICD-10-CM | POA: Diagnosis present

## 2022-04-06 DIAGNOSIS — R0781 Pleurodynia: Secondary | ICD-10-CM | POA: Insufficient documentation

## 2022-04-06 DIAGNOSIS — Z79899 Other long term (current) drug therapy: Secondary | ICD-10-CM | POA: Diagnosis not present

## 2022-04-06 DIAGNOSIS — M25521 Pain in right elbow: Secondary | ICD-10-CM | POA: Diagnosis not present

## 2022-04-06 DIAGNOSIS — W19XXXA Unspecified fall, initial encounter: Secondary | ICD-10-CM | POA: Diagnosis not present

## 2022-04-06 DIAGNOSIS — M25511 Pain in right shoulder: Secondary | ICD-10-CM | POA: Insufficient documentation

## 2022-04-06 DIAGNOSIS — J45909 Unspecified asthma, uncomplicated: Secondary | ICD-10-CM | POA: Diagnosis not present

## 2022-04-06 DIAGNOSIS — E119 Type 2 diabetes mellitus without complications: Secondary | ICD-10-CM | POA: Diagnosis not present

## 2022-04-06 NOTE — ED Provider Triage Note (Signed)
Emergency Medicine Provider Triage Evaluation Note  Patricia Romero , a 61 y.o. female  was evaluated in triage.  Pt complains of fall. States that yesterday she was pulling something and slipped and fell striking the right side of her head on concrete. She did not loose consciousness and was able to ambulate normally since then. Endorses persistent headache, neck pain, as well as right shoulder and elbow pain. She also endorses pain to her ribs on the right side as well. Denies any shortness of breath.  Review of Systems  Positive:  Negative:   Physical Exam  BP 118/86 (BP Location: Left Arm)   Pulse 91   Temp 98.3 F (36.8 C) (Oral)   Resp 18   SpO2 98%  Gen:   Awake, no distress   Resp:  Normal effort  MSK:   Moves extremities without difficulty  Other:    Medical Decision Making  Medically screening exam initiated at 12:33 PM.  Appropriate orders placed.  Patricia Romero was informed that the remainder of the evaluation will be completed by another provider, this initial triage assessment does not replace that evaluation, and the importance of remaining in the ED until their evaluation is complete.     Vear Clock 04/06/22 1236

## 2022-04-06 NOTE — Discharge Instructions (Signed)
As we discussed, your work-up in the ER today was reassuring for acute abnormalities.  X-ray and CT imaging did not show any acute injuries from your fall.  I recommend that you rest, ice, compress, and elevate areas that are painful.  You may also take Tylenol for additional pain relief.  Follow-up with your primary care doctor in the next few days as needed for further evaluation and management of your symptoms.  Return if development of any new or worsening symptoms.
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
## 2022-04-06 NOTE — ED Triage Notes (Signed)
Pt states that she had a fall yesterday on her R side. States she hit her head on concrete, no LOC. No blood thinners. Pt c/o R arm, R shoulder, and chest wall pain.

## 2022-06-10 IMAGING — MG DIGITAL SCREENING BILAT W/ TOMO W/ CAD
8 series · 9 of 24 positions shown · non-contrast
Comparison: Previous exam(s).

CLINICAL DATA: Screening.

EXAM:
DIGITAL SCREENING BILATERAL MAMMOGRAM WITH TOMO AND CAD

[R CC synth-2D]
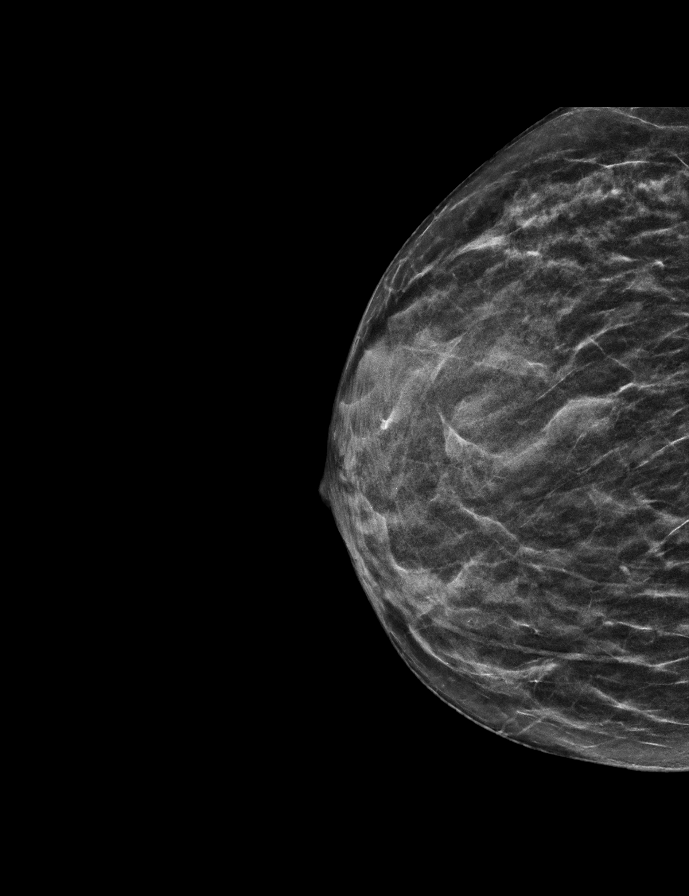

[R MLO synth-2D]
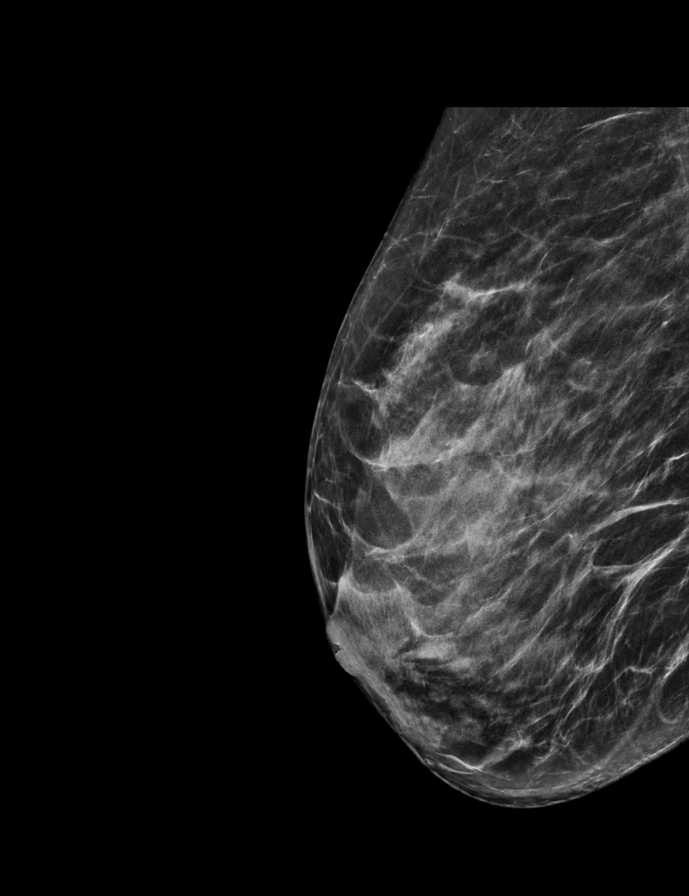

[L CC synth-2D]
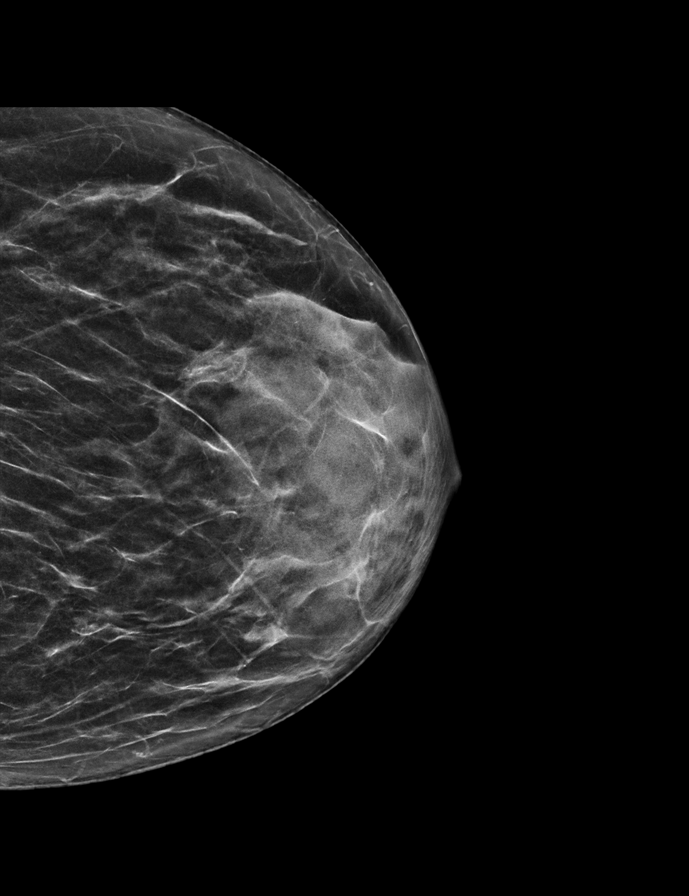

[L MLO synth-2D]
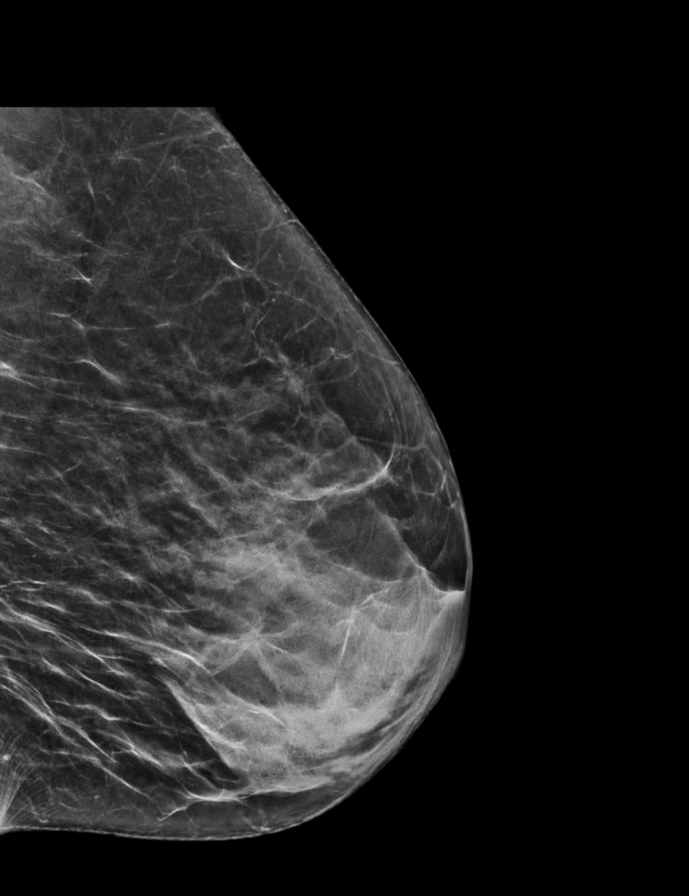

[R MLO tomo · 2 of 55 frames shown]
[frame 18/55]
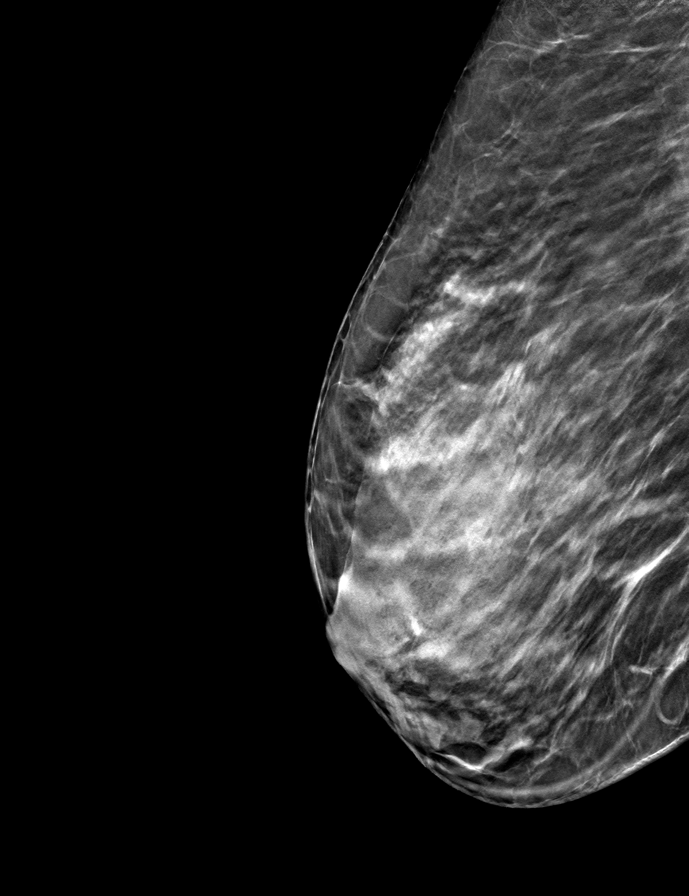
[frame 28/55]
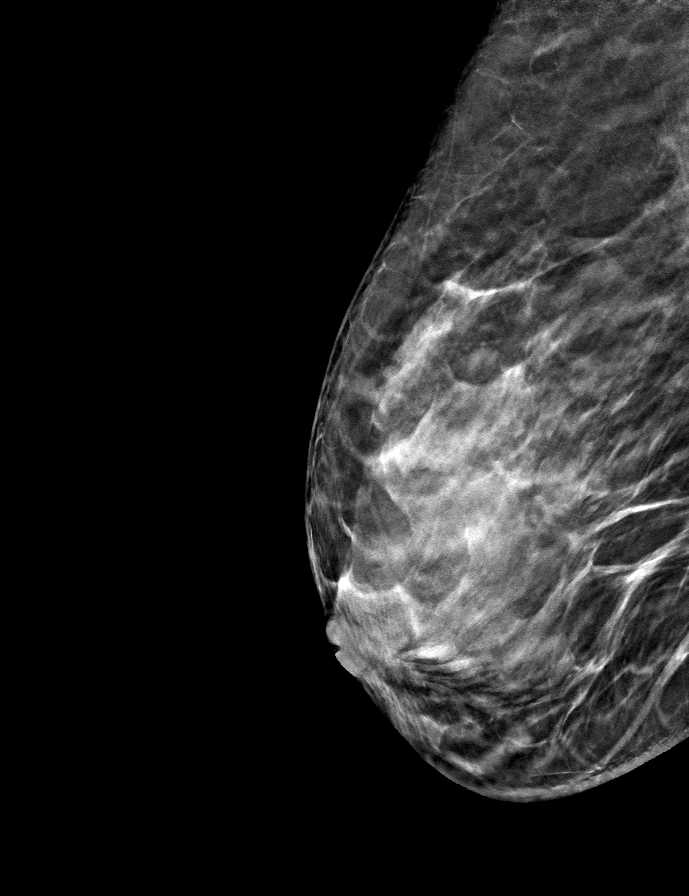

[L CC tomo · tomo slice 26/51.0]
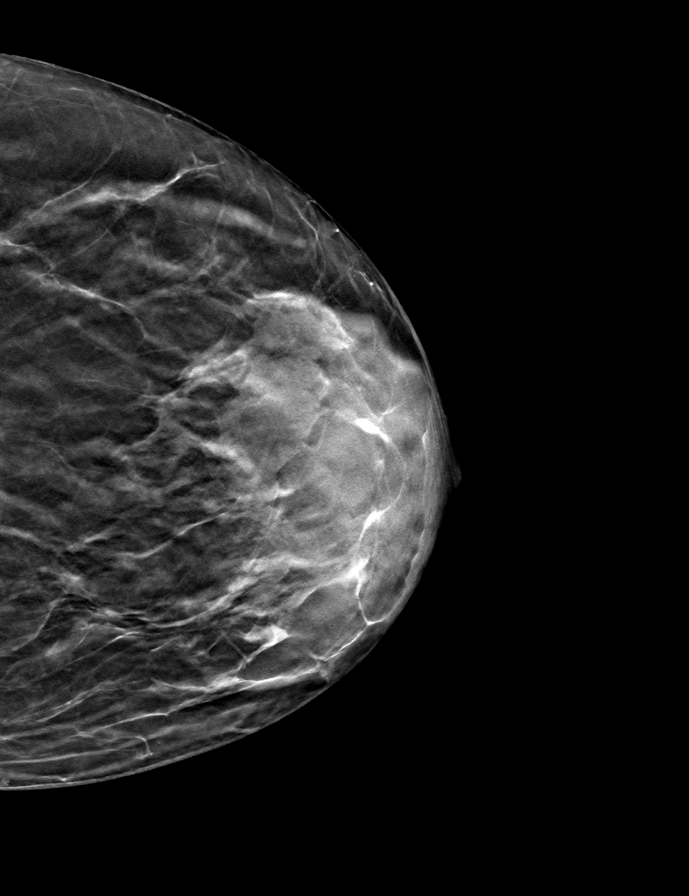

[R CC tomo · tomo slice 21/41.0]
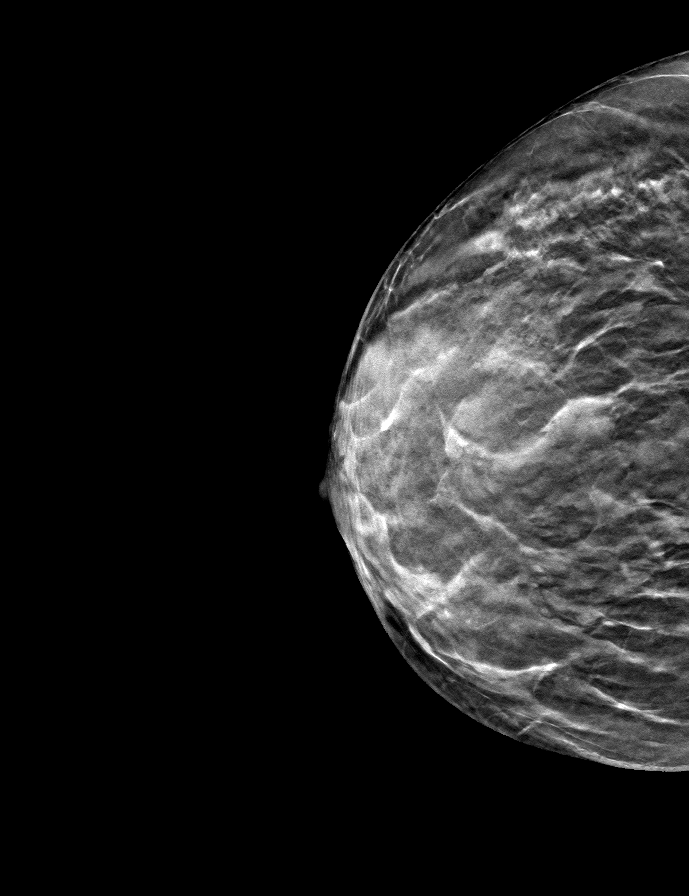

[L MLO tomo · tomo slice 29/57.0]
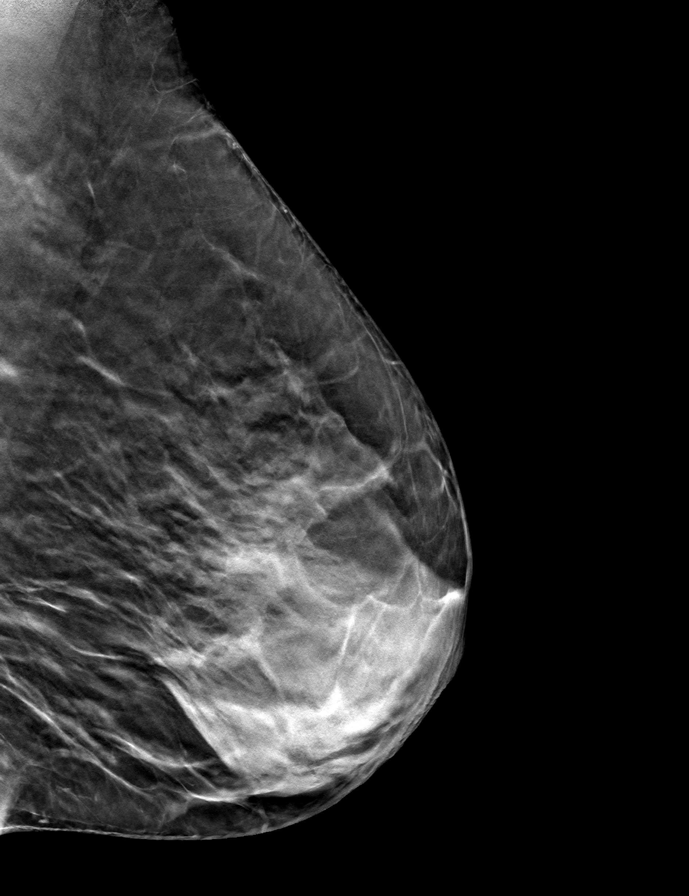

[9 of 24 positions shown; findings below may reference images not displayed]

ACR Breast Density Category c: The breast tissue is heterogeneously
dense, which may obscure small masses.
FINDINGS: There are no findings suspicious for malignancy. Images were
processed with CAD.
IMPRESSION: No mammographic evidence of malignancy. A result letter of this
screening mammogram will be mailed directly to the patient.

RECOMMENDATION:
Screening mammogram in one year. (Code:FT-U-LHB)

BI-RADS CATEGORY  1: Negative.

## 2022-09-27 ENCOUNTER — Other Ambulatory Visit: Payer: Self-pay | Admitting: Family Medicine

## 2022-09-27 DIAGNOSIS — Z1231 Encounter for screening mammogram for malignant neoplasm of breast: Secondary | ICD-10-CM

## 2022-10-27 ENCOUNTER — Ambulatory Visit
Admission: RE | Admit: 2022-10-27 | Discharge: 2022-10-27 | Disposition: A | Discharge status: Home / Self Care | Payer: Medicare Other | Source: Ambulatory Visit | Attending: Family Medicine | Admitting: Family Medicine

## 2022-10-27 DIAGNOSIS — Z1231 Encounter for screening mammogram for malignant neoplasm of breast: Secondary | ICD-10-CM

## 2023-01-02 ENCOUNTER — Ambulatory Visit: Payer: Medicare Other | Admitting: Podiatry

## 2023-01-05 ENCOUNTER — Ambulatory Visit: Payer: Medicare Other | Admitting: Podiatry

## 2023-01-11 ENCOUNTER — Ambulatory Visit: Payer: Medicare (Managed Care) | Admitting: Podiatry

## 2023-10-06 ENCOUNTER — Other Ambulatory Visit: Payer: Self-pay | Admitting: Family Medicine

## 2023-10-06 DIAGNOSIS — Z1231 Encounter for screening mammogram for malignant neoplasm of breast: Secondary | ICD-10-CM

## 2023-10-30 ENCOUNTER — Ambulatory Visit
Admission: RE | Admit: 2023-10-30 | Discharge: 2023-10-30 | Disposition: A | Discharge status: Home / Self Care | Payer: Medicare PPO | Source: Ambulatory Visit | Attending: Family Medicine | Admitting: Family Medicine

## 2023-10-30 DIAGNOSIS — Z1231 Encounter for screening mammogram for malignant neoplasm of breast: Secondary | ICD-10-CM

## 2024-01-02 ENCOUNTER — Ambulatory Visit (INDEPENDENT_AMBULATORY_CARE_PROVIDER_SITE_OTHER): Admitting: Podiatry

## 2024-01-02 ENCOUNTER — Encounter: Payer: Self-pay | Admitting: Podiatry

## 2024-01-02 VITALS — Ht 67.0 in | Wt 130.0 lb

## 2024-01-02 DIAGNOSIS — B351 Tinea unguium: Secondary | ICD-10-CM | POA: Diagnosis not present

## 2024-01-02 DIAGNOSIS — E119 Type 2 diabetes mellitus without complications: Secondary | ICD-10-CM

## 2024-01-03 ENCOUNTER — Encounter: Payer: Self-pay | Admitting: Podiatry

## 2024-01-03 NOTE — Progress Notes (Signed)
  Subjective:  Patient ID: Patricia Romero, female    DOB: [birth year],  MRN: [id number]  Chief Complaint  Patient presents with   Diabetes    DF Exam, Discolored nails bilat    63 y.o. female presents with the above complaint. History confirmed with patient.  Her diabetes is well-controlled  Objective:  Physical Exam: warm, good capillary refill, no trophic changes or ulcerative lesions, normal DP and PT pulses, normal sensory exam, and discoloration right hallux nail with yellow discoloration and subungual debris.  Assessment:   1. Nail fungus   2. Controlled type 2 diabetes mellitus without complication, without long-term current use of insulin (HCC)   3. Encounter for diabetic foot exam [facility])      Plan:  Patient was evaluated and treated and all questions answered.  Patient educated on diabetes. Discussed proper diabetic foot care and discussed risks and complications of disease. Educated patient in depth on reasons to return to the office immediately should he/she discover anything concerning or new on the feet. All questions answered.  Overall low risk  Discussed the dystrophic discoloration of the right hallux nail.  I recommend culture of the nail plate.  Culture was sent to [facility].  I will let her know through MyChart with the results of the show and we will plan to treat accordingly.  No follow-ups on file.

## 2024-01-18 ENCOUNTER — Other Ambulatory Visit: Payer: Self-pay | Admitting: Podiatry

## 2024-01-22 ENCOUNTER — Encounter: Payer: Self-pay | Admitting: Podiatry

## 2024-10-03 ENCOUNTER — Other Ambulatory Visit: Payer: Self-pay | Admitting: Family Medicine

## 2024-10-03 DIAGNOSIS — Z1231 Encounter for screening mammogram for malignant neoplasm of breast: Secondary | ICD-10-CM

## 2024-10-31 ENCOUNTER — Ambulatory Visit
Admission: RE | Admit: 2024-10-31 | Discharge: 2024-10-31 | Disposition: A | Source: Ambulatory Visit | Attending: Family Medicine | Admitting: Family Medicine

## 2024-10-31 DIAGNOSIS — Z1231 Encounter for screening mammogram for malignant neoplasm of breast: Secondary | ICD-10-CM

## 2025-01-07 ENCOUNTER — Ambulatory Visit: Admitting: Podiatry
